# Patient Record
Sex: Male | Born: 1990 | Race: Black or African American | Hispanic: No | Marital: Single | Smoking: Never smoker
Health system: Southern US, Community
[De-identification: ages and names within clinical notes are randomized; demographics above are authoritative.]

## PROBLEM LIST (undated history)

## (undated) DIAGNOSIS — R569 Unspecified convulsions: Secondary | ICD-10-CM

## (undated) DIAGNOSIS — J45909 Unspecified asthma, uncomplicated: Secondary | ICD-10-CM

## (undated) DIAGNOSIS — G40909 Epilepsy, unspecified, not intractable, without status epilepticus: Secondary | ICD-10-CM

## (undated) DIAGNOSIS — R6250 Unspecified lack of expected normal physiological development in childhood: Secondary | ICD-10-CM

---
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
## 2010-01-01 ENCOUNTER — Emergency Department: Payer: Self-pay | Admitting: Emergency Medicine

## 2011-01-23 ENCOUNTER — Emergency Department: Payer: Self-pay | Admitting: Emergency Medicine

## 2012-01-07 ENCOUNTER — Emergency Department: Payer: Self-pay | Admitting: Emergency Medicine

## 2012-03-09 ENCOUNTER — Emergency Department: Payer: Self-pay | Admitting: *Deleted

## 2012-03-09 LAB — DRUG SCREEN, URINE
Amphetamines, Ur Screen: NEGATIVE (ref ?–1000)
Barbiturates, Ur Screen: NEGATIVE (ref ?–200)
Benzodiazepine, Ur Scrn: NEGATIVE (ref ?–200)
Cannabinoid 50 Ng, Ur ~~LOC~~: NEGATIVE (ref ?–50)
MDMA (Ecstasy)Ur Screen: NEGATIVE (ref ?–500)
Methadone, Ur Screen: NEGATIVE (ref ?–300)
Opiate, Ur Screen: NEGATIVE (ref ?–300)
Phencyclidine (PCP) Ur S: NEGATIVE (ref ?–25)
Tricyclic, Ur Screen: NEGATIVE (ref ?–1000)

## 2012-03-09 LAB — COMPREHENSIVE METABOLIC PANEL
Albumin: 4.4 g/dL (ref 3.4–5.0)
Co2: 26 mmol/L (ref 21–32)
EGFR (African American): >60
EGFR (Non-African Amer.): >60
Glucose: 97 mg/dL (ref 65–99)
SGPT (ALT): 33 U/L
Sodium: 142 mmol/L (ref 136–145)
Total Protein: 7.9 g/dL (ref 6.4–8.2)

## 2012-03-09 LAB — URINALYSIS, COMPLETE
Bacteria: NONE SEEN
Bilirubin,UR: NEGATIVE
Blood: NEGATIVE
Leukocyte Esterase: NEGATIVE
Nitrite: NEGATIVE
Ph: 6 (ref 4.5–8.0)
Protein: 100
RBC,UR: 2 /HPF (ref 0–5)
Specific Gravity: 1.026 (ref 1.003–1.030)
Squamous Epithelial: <1
WBC UR: 3 /HPF (ref 0–5)

## 2012-03-09 LAB — CBC
MCH: 33 pg (ref 26.0–34.0)
MCV: 96 fL (ref 80–100)
Platelet: 237 10*3/uL (ref 150–440)
RBC: 4.8 10*6/uL (ref 4.40–5.90)
WBC: 7.7 10*3/uL (ref 3.8–10.6)

## 2012-03-09 LAB — TROPONIN I: Troponin-I: <0.02 ng/mL

## 2012-07-04 ENCOUNTER — Inpatient Hospital Stay: Payer: Self-pay | Admitting: Internal Medicine

## 2012-07-04 LAB — COMPREHENSIVE METABOLIC PANEL
Albumin: 3.9 g/dL (ref 3.4–5.0)
Alkaline Phosphatase: 70 U/L (ref 50–136)
Calcium, Total: 9.1 mg/dL (ref 8.5–10.1)
Chloride: 107 mmol/L (ref 98–107)
Co2: 27 mmol/L (ref 21–32)
EGFR (African American): >60
EGFR (Non-African Amer.): >60
Osmolality: 285 (ref 275–301)
SGOT(AST): 20 U/L (ref 15–37)
SGPT (ALT): 27 U/L
Sodium: 143 mmol/L (ref 136–145)

## 2012-07-04 LAB — URINALYSIS, COMPLETE
Bacteria: NONE SEEN
Bilirubin,UR: NEGATIVE
Glucose,UR: 150 mg/dL (ref 0–75)
Ketone: NEGATIVE
Ph: 6 (ref 4.5–8.0)
Protein: NEGATIVE
RBC,UR: 1 /HPF (ref 0–5)
Squamous Epithelial: NONE SEEN

## 2012-07-04 LAB — DRUG SCREEN, URINE
Amphetamines, Ur Screen: NEGATIVE (ref ?–1000)
Barbiturates, Ur Screen: NEGATIVE (ref ?–200)
Benzodiazepine, Ur Scrn: NEGATIVE (ref ?–200)
Cocaine Metabolite,Ur ~~LOC~~: NEGATIVE (ref ?–300)
MDMA (Ecstasy)Ur Screen: NEGATIVE (ref ?–500)
Methadone, Ur Screen: NEGATIVE (ref ?–300)
Opiate, Ur Screen: NEGATIVE (ref ?–300)
Tricyclic, Ur Screen: NEGATIVE (ref ?–1000)

## 2012-07-04 LAB — CBC
HGB: 16.8 g/dL (ref 13.0–18.0)
MCV: 94 fL (ref 80–100)
Platelet: 237 10*3/uL (ref 150–440)
RBC: 5.13 10*6/uL (ref 4.40–5.90)
RDW: 12.6 % (ref 11.5–14.5)
WBC: 4.7 10*3/uL (ref 3.8–10.6)

## 2012-08-01 ENCOUNTER — Ambulatory Visit: Payer: Self-pay | Admitting: Neurology

## 2015-03-22 NOTE — Discharge Summary (Signed)
PATIENT NAME:  Isaac Vaughn, Isaac Vaughn MR#:  [id number] DATE OF BIRTH:  11-24-1991  DATE OF ADMISSION:  07/04/2012 DATE OF DISCHARGE:  07/06/2012  ADMITTING DIAGNOSIS: Tonic-clonic seizure.   DISCHARGE DIAGNOSES:  1. Tonic-clonic seizure, second episode, patient now on antiepileptics.  2. Elevated blood pressure noted during initial presentation, could be related to agitation/anxiety. His diet needs to be monitored.  3. Hyperglycemia on presentation, only blood sugar of 100 was likely reactive.  4. Seasonal allergies.  5. Cognitive impairment.   CONSULTANT: Neurology, Dr. Sherryll BurgerShah    LABORATORY, DIAGNOSTIC, AND RADIOLOGICAL DATA: Laboratory evaluation showed a glucose of 100, BUN 12, creatinine 1.18, sodium 143, potassium 3.8, chloride 107, CO2 27, calcium 9.1. LFTs were normal. TUDS negative. WBC 4.7, hemoglobin 16.8, platelet count 273.   His CT scan of the head showed no acute abnormality.   MRI of the brain showed no acute abnormality.   HOSPITAL COURSE: Please refer to history and physical done by the admitting physician. The patient is a 24 year old [place] male with history of special needs/mentally challenged who was brought in by family for episode of tonic-clonic seizure. The patient had a witnessed seizure in April 2013 and was brought to the ED. Work-up including CT scan of the head was negative. The patient was not started on any antiepileptics due to his first episode. He again experienced the same tonic-clonic type of seizure activity with postictal state. He was brought back to the ED. He had a CT scan the head which was negative. He also underwent an MRI to make sure there was no other abnormality causing seizures. The patient was started on Keppra due to his second episode. He was seen by Dr. Cristopher PeruHemang Shah who recommended continuing Keppra. Due to it being the weekend, he recommended getting an EEG done as an outpatient which will be arranged. The patient had not had any further  episodes after his initial episode. The patient is stable for discharge.   DISCHARGE MEDICATIONS:  1. Claritin 10 mg daily.  2. Keppra 500 1 tab p.o. b.i.d. x14 days, then Keppra 750 1 tab p.o. b.i.d.   HOME OXYGEN: None.   DIET: Low sodium.   ACTIVITY: As tolerated.   The patient does not drive, which he needs to continue not to drive which is recommended.   TIMEFRAME FOR FOLLOW-UP: 1 to 2 weeks with primary MD and 1 to 2 weeks with Dr. Sherryll BurgerShah. The patient is to have an outpatient EEG.   TIME SPENT ON DISCHARGE: 35 minutes.   ____________________________ Lacie ScottsShreyang H. Allena KatzPatel, MD shp:drc D: 07/08/2012 08:07:56 ET T: 07/08/2012 08:45:59 ET JOB#: [id number]  cc: Alleah Dearman H. Allena KatzPatel, MD, <Dictator> Charise CarwinSHREYANG H Murdis Flitton MD ELECTRONICALLY SIGNED 07/10/2012 13:41

## 2015-03-22 NOTE — Consult Note (Signed)
Brief Consult Note: Diagnosis: 2nd unprovoked seizure, Intellectual Disability, Epilepsy.   Patient was seen by consultant.   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 - MRI brain without contrast OK (thin coronal cuts through temporal lobes not available) - agree with levetiracetam 500 mg bid. - EEG can be done as out pt (as it is not available till Monday) - Pt back to baseline per mother. - will follow as out pt.  Electronic Signatures: Jolene ProvostShah, Alysandra Lobue Kalpeshkumar (MD)  (Signed 03-Aug-13 22:11)  Authored: Brief Consult Note   Last Updated: 03-Aug-13 22:11 by Jolene ProvostShah, Ottie Neglia Kalpeshkumar (MD)

## 2015-03-22 NOTE — Consult Note (Signed)
PATIENT NAME:  Isaac Vaughn, Christohper M MR#:  [id number] DATE OF BIRTH:  04-27-1991  DATE OF CONSULTATION:  07/05/2012  REFERRING PHYSICIAN:  Dr. Dava NajjarPanwar  CONSULTING PHYSICIAN:  Jennelle Pinkstaff K. Sherryll BurgerShah, MD  REASON FOR CONSULTATION: Seizure.   HISTORY OF PRESENT ILLNESS: Isaac Vaughn is a 24 year old African American gentleman with intellectual disability. He had first seizure in April 2013.   He had a second seizure on 07/04/2012. When he did not make up in the morning his usual time and mother sent his younger brother to the room to check on him and younger brother stated he is not feeling well.   When the mother reached the room patient was noticed to have open glazed-looking eyes in his bed. He was stiff and was having shivering. He was having heavy breathing. He was making some sounds from his mouth.   This lasted for a couple of seconds. He has tears coming out of his eyes. He did not bite his tongue for pee on himself or had a bowel movement.   Afterwards patient was confused and could not walk and could not speak for almost 1 to 2 hours.   Patient was very sleepy and tired and drained.   It took him around 24 hours to become back to himself.   Patient's mother mentioned that his seminology was very similar in April but not as intense.   Patient is not having any spells of waking up in unusual places or having unexplained any bruises, etc., or having spells of memory loss.   Patient has a history of developmental disability in the terms of he was had full term, had full immunizations but he just didn't walk at the right age and didn't talk at the right age.   He has finished a special ed high school education and at home he is living with his mother. He is able to take care of his activities of daily living but he cannot follow complex commands. He needed all the commands to be broke down into very simple things.   He is not eating healthy per mother.   PAST MEDICAL HISTORY:  1. Intellectual  disability. 2. Seasonal allergies. He takes Claritin on a regular basis.   PAST SURGICAL HISTORY: None.   ALLERGIES: He is allergic to no known drugs.   FAMILY HISTORY: Significant that mother has hypertension. Father has hyperlipidemia. One of his uncles had history of seizure as a child. Cousin on the mother's side has a history of seizure.   SOCIAL HISTORY: Significant that he does not smoke, does not drink alcohol. Never had DUI. Does not do recreational drugs, etc.   REVIEW OF SYSTEMS: Patient's review of systems was difficult to obtain.   PHYSICAL EXAMINATION:  VITAL SIGNS: Temperature 98.3, pulse 69, respiratory rate 17, blood pressure 152/94, pulse oximetry 100%.   He is an obese African American young gentleman lying in bed, not in acute distress, surrounded by family members.   He has some lesion on his scalp. 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   Patient smiles at some times inappropriately.   LUNGS: Clear to auscultation.   HEART: S1, S2 heart sounds. Carotid exam did not reveal any bruit.   Funduscopic exam was attempted but his pupils are very miotic.    NEUROLOGIC: On his mental status he was alert. He was oriented. He followed one-step commands. He was "simple minded".   Had difficulty with following two-step inverted commands.   He would  look at his mother with some complex questions.   But otherwise his attention and concentration seems to be okay.   He has impairment of his remote memory and fund of knowledge.   On his cranial nerves, his pupils were equal, round, and reactive. Extraocular movements are intact. His face was symmetric. Tongue was midline. Facial sensations were intact. His visual seems symmetric. His hearing was okay.   On his motor examination, he has normal tone and strength of 5/5 in all extremities.   His sensations were intact to light touch. His deep tendon reflexes were 1+. His toes were mute.   I did not  check his gait but mother told me that his gait was normal.   ASSESSMENT AND PLAN:  1. Second unprovoked seizure with normal electrolytes, no known alcohol or sleep deprivation.   He does not have early morning myoclonic jerks.   His neuroimaging was unremarkable by report.   Due to his history of intellectual disability and him having second unprovoked seizure I think he has epilepsy and we should start him on antiepileptic medication.   He has already been started on levetiracetam 500 mg p.o. b.i.d. We should continue that.   I talked to the family about potential medication side effects, interactions, etc.   I talked to them about etiology, pathogenesis and progression of epilepsy.   I talked to the patient and family about seizure precautions such as keeping the patient safe at the time of seizure, turning him on his side, not putting anything in mouth. Patient should not be driving (he doesn't  drive anyway due to his intellectual disability).   He should not be swimming by himself unsupervised.   I also talked to them about the things to look out for in terms of complex partial seizures.   Based on his semiology afraid of temporal lobe seizures.   2. Intellectual disability/static encephalopathy. He seems to have compensated really well and he has a good social support system.   3. Obesity. Briefly advised mom that he should be eating healthy, etc.    I will see him back as an outpatient. Patient has an EEG scheduled but unfortunately we do not have a tech available until Monday. As he is back to himself he can be discharged and EEG can be done as an outpatient.   ____________________________ Luvinia Lucy K. Sherryll Burger, MD hks:cms D: 07/05/2012 22:25:44 ET T: 07/06/2012 13:53:39 ET JOB#: [id number]  cc: Kendan Cornforth K. Sherryll Burger, MD, <Dictator> [facility] MD ELECTRONICALLY SIGNED 07/06/2012 18:18

## 2015-03-22 NOTE — H&P (Signed)
PATIENT NAME:  Isaac Vaughn, Isaac Vaughn MR#:  [id number] DATE OF BIRTH:  [birth year]  DATE OF ADMISSION:  07/04/2012  REFERRING ER PHYSICIAN:  Dr. Enedina FinnerGoli [facility]   CHIEF COMPLAINT: Seizure.   HISTORY OF PRESENT ILLNESS: The patient is a 24 year old male with history of special needs/mentally challenged who is not able to provide much information. The majority of information was obtained from the patient's mother and other family members. The patient had a witnessed seizure in April 2013 and was brought to the Emergency Room at that time. Work-up including CT of the head was negative and the patient was discharged home to follow up with his PCP. Subsequently the mother took the patient to the primary care physician who found no abnormalities and told the mother that if the patient had another seizure he would be referred for specialty consultation to a neurologist.  The patient was doing well since April until this morning when the patient's younger brother went to call him and came back and told the mother that the patient reported that he did not feel well.  The mother went to see him and found him having a generalized tonic-clonic seizure. Therefore she called EMS and brought him to the Emergency Room. The patient was postictal.  Currently he is awake and alert. He is able to tell me his age and name, but as per the family is not back to his baseline.   ALLERGIES: No known drug allergies.   PAST MEDICAL HISTORY:  1. Mentally challenged. 2. Seasonal allergies.   MEDICATIONS: Claritin as needed.   PAST SURGICAL HISTORY: None.   FAMILY HISTORY: Mother has hypertension. Father has hyperlipidemia. One of his uncles had one episode of seizure as a child. Cousin on the mother's side had seizures.   SOCIAL HISTORY: There is no history of smoking, alcohol, or drug abuse.  The patient is mentally challenged and lives with his mother.    REVIEW OF SYSTEMS: The  patient is mentally challenged and does not provide a reliable review of systems.   PHYSICAL EXAMINATION:  VITAL SIGNS: Temperature 98.5, heart rate 74, respiratory rate 18, blood pressure 164/82, pulse oximetry 100% on room air.   GENERAL: The patient is a young African American who is overweight, sitting comfortably in bed, not in acute distress.   HEAD: Atraumatic, normocephalic.   EYES: No pallor, icterus, or cyanosis. Pupils equal, round, reactive to light and accommodation. Extraocular movements intact.   ENT: Wet mucous membranes. No oropharyngeal erythema or thrush. There is no evidence of any tongue bite.   NECK: Supple. No masses. No JVD. No thyromegaly or lymphadenopathy.   CHEST WALL: No tenderness to palpation. Not using accessory muscles of respiration. No intercostal muscle retractions.   LUNGS: Bilaterally clear to auscultation. No wheezing, rales, or rhonchi.   CARDIOVASCULAR: S1, S2 regular. No murmur, rubs, or gallops.   ABDOMEN: Soft, nontender, nondistended. No guarding or rigidity. No organomegaly. Normal bowel sounds.   SKIN: No rashes or lesions.  PERIPHERIES: No pedal edema. 2+ pedal pulses.   MUSCULOSKELETAL: No cyanosis or clubbing.   NEUROLOGIC: Awake, alert, oriented times three. Nonfocal neurological exam. Cranial nerves grossly intact.   PSYCH: Normal mood and affect.   LABORATORY, DIAGNOSTIC, AND RADIOLOGICAL DATA: Urinalysis shows no evidence of infection. CBC is normal. Urine drug screen is negative. Complete metabolic panel is normal other than glucose of 100.   ASSESSMENT AND PLAN: 24 year old male with history of special needs,  mentally challenged, who had a seizure in 03/2012 at which time a CT of the head was negative. He was discharged home and seen by his primary care physician who told the family that if the patient had another seizure he would be referred to a neurologist.  He was brought in by his mother for a second witnessed seizure.   1. Recurrent seizures: We will admit the patient to the hospital and obtain EEG, MRI of the brain, and neurology consultation. We will load him with Keppra and start IV Keppra b.i.d.  We will also place on p.r.n. Ativan for breakthrough seizures. 2. Hyperglycemia, possibly reactive.  3. Elevated blood pressure. This could be related to his recent seizure. However, he also overweight with family history of hypertension. We will monitor his blood pressure and start him on antihypertensive medications as needed to achieve good hypertensive control.  Reviewed all medical records, discussed with the ER physician,  discussed with the patient's family the plan of care and management.   TIME SPENT: 75 minutes.   ____________________________ Darrick Meigs, MD sp:bjt D: 07/04/2012 14:35:06 ET T: 07/04/2012 14:54:14 ET JOB#: [id number]  cc: Darrick Meigs, MD, <Dictator> [facility] Darrick Meigs MD ELECTRONICALLY SIGNED 07/04/2012 17:41

## 2015-07-10 ENCOUNTER — Emergency Department
Admission: EM | Admit: 2015-07-10 | Discharge: 2015-07-11 | Disposition: A | Discharge status: Home / Self Care | Payer: Medicaid Other | Attending: Student | Admitting: Student

## 2015-07-10 ENCOUNTER — Encounter: Payer: Self-pay | Admitting: Emergency Medicine

## 2015-07-10 DIAGNOSIS — G40909 Epilepsy, unspecified, not intractable, without status epilepticus: Secondary | ICD-10-CM | POA: Insufficient documentation

## 2015-07-10 DIAGNOSIS — R569 Unspecified convulsions: Secondary | ICD-10-CM | POA: Diagnosis present

## 2015-07-10 DIAGNOSIS — Z79899 Other long term (current) drug therapy: Secondary | ICD-10-CM | POA: Insufficient documentation

## 2015-07-10 HISTORY — DX: Epilepsy, unspecified, not intractable, without status epilepticus: G40.909

## 2015-07-10 LAB — CBC WITH DIFFERENTIAL/PLATELET
Basophils Absolute: 0 K/uL (ref 0–0.1)
Basophils Relative: 0 %
Eosinophils Absolute: 0.1 K/uL (ref 0–0.7)
Eosinophils Relative: 2 %
HCT: 47.3 % (ref 40.0–52.0)
Hemoglobin: 16.2 g/dL (ref 13.0–18.0)
Lymphocytes Relative: 32 %
Lymphs Abs: 2.1 K/uL (ref 1.0–3.6)
MCH: 33.1 pg (ref 26.0–34.0)
MCHC: 34.3 g/dL (ref 32.0–36.0)
MCV: 96.7 fL (ref 80.0–100.0)
Monocytes Absolute: 0.5 K/uL (ref 0.2–1.0)
Monocytes Relative: 8 %
Neutro Abs: 3.9 K/uL (ref 1.4–6.5)
Neutrophils Relative %: 58 %
Platelets: 205 K/uL (ref 150–440)
RBC: 4.89 MIL/uL (ref 4.40–5.90)
RDW: 12.8 % (ref 11.5–14.5)
WBC: 6.7 K/uL (ref 3.8–10.6)

## 2015-07-10 LAB — GLUCOSE, CAPILLARY: Glucose-Capillary: 116 mg/dL — ABNORMAL HIGH (ref 65–99)

## 2015-07-10 LAB — COMPREHENSIVE METABOLIC PANEL
ALT: 25 U/L (ref 17–63)
AST: 34 U/L (ref 15–41)
Albumin: 4.7 g/dL (ref 3.5–5.0)
Alkaline Phosphatase: 45 U/L (ref 38–126)
Anion gap: 6 (ref 5–15)
BUN: 14 mg/dL (ref 6–20)
CALCIUM: 9.3 mg/dL (ref 8.9–10.3)
CHLORIDE: 108 mmol/L (ref 101–111)
CO2: 27 mmol/L (ref 22–32)
Creatinine, Ser: 1.26 mg/dL — ABNORMAL HIGH (ref 0.61–1.24)
GFR calc Af Amer: >60 mL/min (ref >60)
GFR calc non Af Amer: >60 mL/min (ref >60)
Glucose, Bld: 100 mg/dL — ABNORMAL HIGH (ref 65–99)
Potassium: 3.8 mmol/L (ref 3.5–5.1)
Sodium: 141 mmol/L (ref 135–145)
TOTAL PROTEIN: 7.8 g/dL (ref 6.5–8.1)
Total Bilirubin: 0.5 mg/dL (ref 0.3–1.2)

## 2015-07-10 MED ORDER — ZONISAMIDE 100 MG PO CAPS
200.0000 mg | ORAL_CAPSULE | Freq: Once | ORAL | Status: AC
  Administered 2015-07-10: 200 mg via ORAL
  Filled 2015-07-10: qty 2

## 2015-07-10 NOTE — ED Notes (Signed)
MD entered order for pt to take own Zonisamide; pt currently having difficulty swallowing even a small sip of water; MD aware; holding medication at this time; pt's mother and grandmother at bedside

## 2015-07-10 NOTE — ED Notes (Signed)
Side rails up x 2 and padded at this time for safety; pt remains alert and answering questions with shake yes/no of head;

## 2015-07-10 NOTE — ED Provider Notes (Addendum)
[facility] Emergency Department Provider Note  ____________________________________________  Time seen: Approximately 9:30 PM  I have reviewed the triage vital signs and the nursing notes.   HISTORY  Chief Complaint Seizures  Caveat-history of present illness and review of systems Limited secondary to the patient's inability to verbalize at this time. History of present illness and review of systems is obtained from family at bedside.  HPI Isaac Vaughn is a 24 y.o. male with "special needs", history of seizure disorder presents for evaluation after seizure which occurred suddenly just prior to arrival, now resolved. Family at bedside reports that the patient had a 10-15 second episode where his "eyes rolled back and his arms were tense and shaking". He did not fall or hit his head. He has a history of grand mal seizures. He was not able to take his zonisamide last night because it was packed away in a  box as they have recently moved/change residencies. Family reports that the patient has not been ill recently, no cough, sneezing, runny nose, congestion, vomiting, diarrhea, fevers or chills. Family reports that after seizure, it is customary for him to not be able to verbalize/speak for some time. Current severity of symptoms is moderate.   Past Medical History  Diagnosis Date  . Epilepsy     There are no active problems to display for this patient.   History reviewed. No pertinent past surgical history.  Current Outpatient Rx  Name  Route  Sig  Dispense  Refill  . montelukast (SINGULAIR) 10 MG tablet   Oral   Take 1 tablet by mouth daily.      3   . zonisamide (ZONEGRAN) 100 MG capsule   Oral   Take 2 capsules by mouth daily.      1     Allergies Review of patient's allergies indicates not on file.  History reviewed. No pertinent family history.  Social History History  Substance Use Topics  . Smoking status: Never Smoker   . Smokeless  tobacco: Not on file  . Alcohol Use: No    Review of Systems   Caveat-history of present illness and review of systems Limited secondary to the patient's inability to verbalize at this time. History of present illness and review of systems is obtained from family at bedside. ____________________________________________   PHYSICAL EXAM:  VITAL SIGNS: ED Triage Vitals  Enc Vitals Group     BP 07/10/15 2119 159/83 mmHg     Pulse Rate 07/10/15 2119 94     Resp 07/10/15 2119 21     Temp 07/10/15 2119 98.3 F (36.8 C)     Temp Source 07/10/15 2119 Oral     SpO2 07/10/15 2119 100 %     Weight 07/10/15 2119 238 lb (107.956 kg)     Height 07/10/15 2119 5\' 10"  (1.778 m)     Head Cir --      Peak Flow --      Pain Score 07/10/15 2120 0     Pain Loc --      Pain Edu? --      Excl. in GC? --     Constitutional: The patient is awake, alert, looking around the room, does not speak/verbalize but follows commands, answers questions by nodding yes/no with his head. Eyes: Conjunctivae are normal. PERRL. EOMI. Head: Atraumatic. Nose: No congestion/rhinnorhea. Mouth/Throat: Mucous membranes are moist.  Oropharynx non-erythematous. Neck: No stridor.   Cardiovascular: Normal rate, regular rhythm. Grossly normal heart sounds.  Good peripheral circulation. Respiratory: Normal respiratory effort.  No retractions. Lungs CTAB. Gastrointestinal: Soft and nontender. No distention. No abdominal bruits. No CVA tenderness. Genitourinary: deferred Musculoskeletal: No lower extremity tenderness nor edema.  No joint effusions. Neurologic: No gross focal neurologic deficits are appreciated. 5 out of 5 strength in bilateral upper and lower extremities though full effort requires significant encouragement. Sensation intact to light touch throughout. Skin:  Skin is warm, dry and intact. No rash noted. Psychiatric: Mood and affect are normal. Speech and behavior are  normal.  ____________________________________________   LABS (all labs ordered are listed, but only abnormal results are displayed)  Labs Reviewed  COMPREHENSIVE METABOLIC PANEL - Abnormal; Notable for the following:    Glucose, Bld 100 (*)    Creatinine, Ser 1.26 (*)    All other components within normal limits  GLUCOSE, CAPILLARY - Abnormal; Notable for the following:    Glucose-Capillary 116 (*)    All other components within normal limits  CBC WITH DIFFERENTIAL/PLATELET  CBG MONITORING, ED   ____________________________________________  EKG  ED ECG REPORT I, Gayla Doss, the attending physician, personally viewed and interpreted this ECG.   Date: 07/10/2015  EKG Time: 21:18  Rate: 84  Rhythm: sinus rhythm marked sinus arrhythmia  Axis: normal  Intervals:none  ST&T Change: No acute ST segment elevation. EKG unchanged from 07/04/2012.  ____________________________________________  RADIOLOGY  none ____________________________________________   PROCEDURES  Procedure(s) performed: None  Critical Care performed: No  ____________________________________________   INITIAL IMPRESSION / ASSESSMENT AND PLAN / ED COURSE  Pertinent labs & imaging results that were available during my care of the patient were reviewed by me and considered in my medical decision making (see chart for details).  Isaac Vaughn is a 24 y.o. male with history of "special needs" and seizure disorder who presents for evaluation after seizure which occurred suddenly just prior to arrival, now resolved. On exam, he is generally well-appearing and in no acute distress. Vital signs stable, he is afebrile. He has an intact neurological exam, is awake, alert, looking around the room but is not verbalizing.  Family reports that this is customary for him after he has a seizure, though it seems a bit unusual to me as this is his only postictal symptom. Suspect seizure secondary to medication  noncompliance given that he missed his home zonisamide dose last night. We'll give his home medications, and observe in the emergency department. Labs notable for mild creatinine elevation at 1.26, baseline appears to be 1.18 so he is not far from baseline. Normal CBC. Normal glucose. No indication for head imaging at this time though would obtain head imaging for prolonged post-ictal symptomatology or recurrent seizure.  ----------------------------------------- 11:36 PM on 07/10/2015 -----------------------------------------  I administered the patient's medications by mouth without any issue; he swallowed them just fine and is tolerating PO. Patient still awake, alert, nonverbal. Family reports that he can take "a few hours" for him to be able to speak again after seizure. He has had no recurrent seizure activity since arrival to the emergency department. Anticipate discharge when he is back to baseline. Care transferred to Dr. Ladona Ridgel at this time. ____________________________________________   FINAL CLINICAL IMPRESSION(S) / ED DIAGNOSES  Final diagnoses:  Seizure      Gayla Doss, MD 07/10/15 [id number]  Gayla Doss, MD 07/10/15 [id number]  Gayla Doss, MD 07/10/15 [id number]

## 2015-07-10 NOTE — ED Notes (Signed)
EMS pt from home following grand mal seizure lasting 10-15 seconds per family; EMS reports another seizure after their arrival; pt nodding head yes/no to questions upon arrival; nonverbal at this time; FSBS 93, BP 148/88; pt with history of seizures and did not take his prescribed medication for same last night;

## 2015-07-11 NOTE — ED Provider Notes (Signed)
Progress note:    ----------------------------------------- 12:34 AM on 07/11/2015 -----------------------------------------  Patient has been observed for quite some time since his seizure. Initially patient felt like he could not talk after seizure which was normal. Patient is now feeling much better and is able to talk normally and is alert and oriented 3. Patient is going to be given a refill on his prescription and be discharged home to follow up with his neurologist or family M.D.  Leona Carry, MD 07/11/15 [phone]

## 2015-11-16 ENCOUNTER — Emergency Department
Admission: EM | Admit: 2015-11-16 | Discharge: 2015-11-17 | Disposition: A | Discharge status: Home / Self Care | Payer: Medicaid Other | Attending: Emergency Medicine | Admitting: Emergency Medicine

## 2015-11-16 DIAGNOSIS — Z79899 Other long term (current) drug therapy: Secondary | ICD-10-CM | POA: Insufficient documentation

## 2015-11-16 DIAGNOSIS — R569 Unspecified convulsions: Secondary | ICD-10-CM | POA: Diagnosis present

## 2015-11-16 MED ORDER — ACETAMINOPHEN 325 MG PO TABS
ORAL_TABLET | ORAL | Status: AC
  Administered 2015-11-16: 650 mg via ORAL
  Filled 2015-11-16: qty 2

## 2015-11-16 MED ORDER — ACETAMINOPHEN 325 MG PO TABS
650.0000 mg | ORAL_TABLET | Freq: Once | ORAL | Status: AC
  Administered 2015-11-16: 650 mg via ORAL

## 2015-11-16 NOTE — Discharge Instructions (Signed)
You were evaluated after a seizure, and you should call your neurologist in the morning to discuss and changes in medications.  Return to the emergency department for any worsening condition including seizure lasting longer than 5 minutes, any injuries, or any other new neurologic symptoms such as headache, confusion or altered mental status.   Seizure, Adult A seizure is abnormal electrical activity in the brain. Seizures usually last from 30 seconds to 2 minutes. There are various types of seizures. Before a seizure, you may have a warning sensation (aura) that a seizure is about to occur. An aura may include the following symptoms:   Fear or anxiety.  Nausea.  Feeling like the room is spinning (vertigo).  Vision changes, such as seeing flashing lights or spots. Common symptoms during a seizure include:  A change in attention or behavior (altered mental status).  Convulsions with rhythmic jerking movements.  Drooling.  Rapid eye movements.  Grunting.  Loss of bladder and bowel control.  Bitter taste in the mouth.  Tongue biting. After a seizure, you may feel confused and sleepy. You may also have an injury resulting from convulsions during the seizure. HOME CARE INSTRUCTIONS   If you are given medicines, take them exactly as prescribed by your health care provider.  Keep all follow-up appointments as directed by your health care provider.  Do not swim or drive or engage in risky activity during which a seizure could cause further injury to you or others until your health care provider says it is OK.  Get adequate rest.  Teach friends and family what to do if you have a seizure. They should:  Lay you on the ground to prevent a fall.  Put a cushion under your head.  Loosen any tight clothing around your neck.  Turn you on your side. If vomiting occurs, this helps keep your airway clear.  Stay with you until you recover.  Know whether or not you need emergency  care. SEEK IMMEDIATE MEDICAL CARE IF:  The seizure lasts longer than 5 minutes.  The seizure is severe or you do not wake up immediately after the seizure.  You have an altered mental status after the seizure.  You are having more frequent or worsening seizures. Someone should drive you to the emergency department or call local emergency services (911 in [place] MAKE SURE YOU:  Understand these instructions.  Will watch your condition.  Will get help right away if you are not doing well or get worse.   This information is not intended to replace advice given to you by your health care provider. Make sure you discuss any questions you have with your health care provider.   Document Released: 11/16/2000 Document Revised: 12/10/2014 Document Reviewed: 07/01/2013 Elsevier Interactive Patient Education [employer]2016 Elsevier Inc.

## 2015-11-16 NOTE — ED Notes (Signed)
Pt mother requesting something for pain, states pt is hurting. Pt tearful but unable to state or point to where pain is. Asked pt to squeeze this RN hand if pt hurting in abdomen, pt squeezed hand, asked pt to squeeze hand if pain is new onset, pt squeezed hand for yes. MD notified. Verbal order for Tylenol.

## 2015-11-16 NOTE — ED Provider Notes (Signed)
[facility] Emergency Department Provider Note   ____________________________________________  Time seen:  I have reviewed the triage vital signs and the triage nursing note.  HISTORY  Chief Complaint Seizures   Historian Patient's mom  HPI Isaac Vaughn is a 24 y.o. 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  Last seizure was in August. No change in medication were made at that point in time. Today's episode mom witnessed the patient to have staring episode and glassy appearing eyes and then slipped on the floor. No generalized shaking. This is similar to prior episodes of what sounds like diagnosed partial seizures. [facility]    Past Medical History  Diagnosis Date  . Epilepsy (HCC)     There are no active problems to display for this patient.   History reviewed. No pertinent past surgical history.  Current Outpatient Rx  Name  Route  Sig  Dispense  Refill  . montelukast (SINGULAIR) 10 MG tablet   Oral   Take 1 tablet by mouth daily.      3   . zonisamide (ZONEGRAN) 100 MG capsule   Oral   Take 2 capsules by mouth daily.      1     Allergies Review of patient's allergies indicates no known allergies.  No family history on file.  Social History Social History  Substance Use Topics  . Smoking status: Never Smoker   . Smokeless tobacco: None  . Alcohol Use: No    Review of Systems  Constitutional: Negative for fever. Eyes: Negative for visual changes. ENT: Negative for sore throat. Cardiovascular: Negative for chest pain. Respiratory: Negative for shortness of breath. Gastrointestinal: Negative for abdominal pain, vomiting and diarrhea. Genitourinary: Negative for dysuria. Musculoskeletal: Negative for back pain. Skin: Negative for rash. Neurological: Negative for headache. 10 point Review of Systems otherwise  negative ____________________________________________   PHYSICAL EXAM:  VITAL SIGNS: ED Triage Vitals  Enc Vitals Group     BP 11/16/15 2107 157/84 mmHg     Pulse Rate 11/16/15 2107 74     Resp 11/16/15 2107 25     Temp 11/16/15 2107 98.8 F (37.1 C)     Temp Source 11/16/15 2107 Oral     SpO2 11/16/15 2107 100 %     Weight --      Height --      Head Cir --      Peak Flow --      Pain Score 11/16/15 2107 0     Pain Loc --      Pain Edu? --      Excl. in GC? --      Constitutional: Alert with eyes open but not following commands. Well appearing and in no distress. Eyes: Conjunctivae are normal. PERRL. Normal extraocular movements. ENT   Head: Normocephalic and atraumatic.   Nose: No congestion/rhinnorhea.   Mouth/Throat: Mucous membranes are moist.   Neck: No stridor. Cardiovascular/Chest: Normal rate, regular rhythm.  No murmurs, rubs, or gallops. Respiratory: Normal respiratory effort without tachypnea nor retractions. Breath sounds are clear and equal bilaterally. No wheezes/rales/rhonchi. Gastrointestinal: Soft. No distention, no guarding, no rebound. Nontender   Genitourinary/rectal:Deferred Musculoskeletal: Nontender with normal range of motion in all extremities. No joint effusions.  No lower extremity tenderness.  No edema. Neurologic: Appears postictal, not verbalizing any responses. Intermittently following commands. No gross or focal neurologic deficits are appreciated. Skin:  Skin is warm, dry and intact. No rash noted.   ____________________________________________   EKG I, Governor Rooks, MD, the attending physician have personally viewed and interpreted all ECGs.  No EKG performed ____________________________________________  LABS (pertinent positives/negatives)  None  ____________________________________________  RADIOLOGY All Xrays were viewed by me. Imaging interpreted by  Radiologist.  None __________________________________________  PROCEDURES  Procedure(s) performed: None  Critical Care performed: None  ____________________________________________   ED COURSE / ASSESSMENT AND PLAN  CONSULTATIONS: Discussed with on-call [facility] neurologist, who recommends no acute change in her seizure medications tonight, and asked patient to call office in the morning to discuss change in medications  Pertinent labs & imaging results that were available during my care of the patient were reviewed by me and considered in my medical decision making (see chart for details).   Mom states the episode tonight looked like the patient's typical seizures. No reported trauma. This episode does seem consistent with his prior seizure, and not a new episode of syncope or otherwise. Patient is currently postictal, and mom states his postictal state May last several hours.  Family does not report that the patient uses any illicit drugs, has had sleep deprivation, or any recent illness.  After discussion with on-call neurologist, recommend patient/family call their neurologist office in the morning to discuss any changes in medication regimen and to follow-up appointment.  Patient care transferred to Dr. Manson Passey at midnight for shift change. Patient is being observed until postictal state resolved and patient at baseline mental status. Patient will be discharged with my instructions.  Patient / Family / Caregiver informed of clinical course, medical decision-making process, and agree with plan.   I discussed return precautions, follow-up instructions, and discharged instructions with patient and/or family.   ___________________________________________   FINAL CLINICAL IMPRESSION(S) / ED DIAGNOSES   Final diagnoses:  Seizure (HCC)       Governor Rooks, MD 11/16/15 2337

## 2015-11-16 NOTE — ED Notes (Signed)
Pt bib EMS w/ c/o seizure. Per EMS, pts mother witnessed seizure and stated it was a few seconds long.  Pt does have hx of epilepsy and per EMS, pt does take medication.  Pt alert , NAD, resp even and unlabored.  Pt follows commands but unable to vocalize needs at this time.

## 2015-11-17 LAB — COMPREHENSIVE METABOLIC PANEL
ALBUMIN: 4.3 g/dL (ref 3.5–5.0)
ALT: 16 U/L — ABNORMAL LOW (ref 17–63)
AST: 15 U/L (ref 15–41)
Alkaline Phosphatase: 41 U/L (ref 38–126)
Anion gap: 7 (ref 5–15)
BILIRUBIN TOTAL: 0.5 mg/dL (ref 0.3–1.2)
BUN: 15 mg/dL (ref 6–20)
CHLORIDE: 105 mmol/L (ref 101–111)
CO2: 22 mmol/L (ref 22–32)
Calcium: 8.9 mg/dL (ref 8.9–10.3)
Creatinine, Ser: 1.22 mg/dL (ref 0.61–1.24)
GFR calc Af Amer: >60 mL/min (ref >60)
GLUCOSE: 95 mg/dL (ref 65–99)
POTASSIUM: 3.3 mmol/L — AB (ref 3.5–5.1)
Sodium: 134 mmol/L — ABNORMAL LOW (ref 135–145)
TOTAL PROTEIN: 7.6 g/dL (ref 6.5–8.1)

## 2015-11-17 LAB — CBC
HCT: 48 % (ref 40.0–52.0)
Hemoglobin: 16.5 g/dL (ref 13.0–18.0)
MCH: 33.3 pg (ref 26.0–34.0)
MCHC: 34.3 g/dL (ref 32.0–36.0)
MCV: 96.9 fL (ref 80.0–100.0)
PLATELETS: 210 10*3/uL (ref 150–440)
RBC: 4.95 MIL/uL (ref 4.40–5.90)
RDW: 13.1 % (ref 11.5–14.5)
WBC: 7.1 10*3/uL (ref 3.8–10.6)

## 2015-11-17 LAB — CK: CK TOTAL: 151 U/L (ref 49–397)

## 2015-11-17 MED ORDER — SODIUM CHLORIDE 0.9 % IV BOLUS (SEPSIS)
1000.0000 mL | Freq: Once | INTRAVENOUS | Status: AC
  Administered 2015-11-17: 1000 mL via INTRAVENOUS

## 2015-11-17 MED ORDER — SODIUM CHLORIDE 0.9 % IV BOLUS (SEPSIS): 1000.0000 mL | Freq: Once | INTRAVENOUS | Status: DC

## 2015-11-17 MED ORDER — SODIUM CHLORIDE 0.9 % IV SOLN
Freq: Once | INTRAVENOUS | Status: AC
  Administered 2015-11-17: via INTRAVENOUS

## 2015-11-17 MED ORDER — POTASSIUM CHLORIDE 20 MEQ PO PACK
40.0000 meq | PACK | Freq: Once | ORAL | Status: AC
  Administered 2015-11-17: 40 meq via ORAL
  Filled 2015-11-17: qty 2

## 2015-11-17 NOTE — ED Notes (Addendum)
PT sitting up in bed, appears to be more alert, able to answer questions in complete sentences. Family at bedside. Pt requesting something to eat. MD to be notified.

## 2015-11-17 NOTE — ED Provider Notes (Signed)
I assumed care of the patient at 12:00 AM from Dr. Shaune PollackLord. Patient initially appeared postictal not answering questions but does follow verbal commands. Laboratory data obtained 2 L normal saline administered. On reevaluation patient is now conversant stating that he is hungry and tolerated by mouth. Patient had baseline before discharge from the emergency department.  Darci Currentandolph N Brown, MD 11/17/15 [phone]

## 2015-11-17 NOTE — ED Notes (Signed)
Pt provided with Malaw[place]urkey sandwich and lemon lime soda per MD provider.

## 2015-11-17 NOTE — ED Notes (Signed)

## 2015-11-17 NOTE — ED Notes (Signed)
Pt sitting up in bed, drinking ginger ale.  Pt able to speak in high pitch voice.  Per pts mother, pts voice is normally falsetto after post seizure. Instructed pt to keep arm straight to allow fluids to flow quickly.

## 2016-06-07 ENCOUNTER — Observation Stay
Admission: EM | Admit: 2016-06-07 | Discharge: 2016-06-08 | Disposition: A | Discharge status: Home / Self Care | Payer: Medicaid Other | Attending: Internal Medicine | Admitting: Internal Medicine

## 2016-06-07 ENCOUNTER — Emergency Department: Payer: Medicaid Other

## 2016-06-07 DIAGNOSIS — J45909 Unspecified asthma, uncomplicated: Secondary | ICD-10-CM | POA: Diagnosis not present

## 2016-06-07 DIAGNOSIS — G40909 Epilepsy, unspecified, not intractable, without status epilepticus: Secondary | ICD-10-CM | POA: Diagnosis present

## 2016-06-07 DIAGNOSIS — Z825 Family history of asthma and other chronic lower respiratory diseases: Secondary | ICD-10-CM | POA: Diagnosis not present

## 2016-06-07 DIAGNOSIS — Z8249 Family history of ischemic heart disease and other diseases of the circulatory system: Secondary | ICD-10-CM | POA: Insufficient documentation

## 2016-06-07 DIAGNOSIS — Z79899 Other long term (current) drug therapy: Secondary | ICD-10-CM | POA: Diagnosis not present

## 2016-06-07 DIAGNOSIS — R4182 Altered mental status, unspecified: Secondary | ICD-10-CM | POA: Diagnosis not present

## 2016-06-07 DIAGNOSIS — R569 Unspecified convulsions: Secondary | ICD-10-CM

## 2016-06-07 HISTORY — DX: Unspecified convulsions: R56.9

## 2016-06-07 HISTORY — DX: Unspecified asthma, uncomplicated: J45.909

## 2016-06-07 LAB — URINALYSIS COMPLETE WITH MICROSCOPIC (ARMC ONLY)
Bilirubin Urine: NEGATIVE
Glucose, UA: 50 mg/dL — AB
Hgb urine dipstick: NEGATIVE
KETONES UR: NEGATIVE mg/dL
Leukocytes, UA: NEGATIVE
NITRITE: NEGATIVE
PROTEIN: NEGATIVE mg/dL
RBC / HPF: NONE SEEN RBC/hpf (ref 0–5)
Specific Gravity, Urine: 1.021 (ref 1.005–1.030)
pH: 7 (ref 5.0–8.0)

## 2016-06-07 LAB — BASIC METABOLIC PANEL
ANION GAP: 6 (ref 5–15)
BUN: 16 mg/dL (ref 6–20)
CO2: 24 mmol/L (ref 22–32)
Calcium: 9.3 mg/dL (ref 8.9–10.3)
Chloride: 109 mmol/L (ref 101–111)
Creatinine, Ser: 1.41 mg/dL — ABNORMAL HIGH (ref 0.61–1.24)
Glucose, Bld: 92 mg/dL (ref 65–99)
POTASSIUM: 3.8 mmol/L (ref 3.5–5.1)
SODIUM: 139 mmol/L (ref 135–145)

## 2016-06-07 LAB — CBC
HCT: 48 % (ref 40.0–52.0)
Hemoglobin: 16.3 g/dL (ref 13.0–18.0)
MCH: 33.2 pg (ref 26.0–34.0)
MCHC: 34 g/dL (ref 32.0–36.0)
MCV: 97.8 fL (ref 80.0–100.0)
PLATELETS: 208 10*3/uL (ref 150–440)
RBC: 4.91 MIL/uL (ref 4.40–5.90)
RDW: 12.8 % (ref 11.5–14.5)
WBC: 4.9 10*3/uL (ref 3.8–10.6)

## 2016-06-07 LAB — URINE DRUG SCREEN, QUALITATIVE (ARMC ONLY)
Amphetamines, Ur Screen: NOT DETECTED
Barbiturates, Ur Screen: NOT DETECTED
Benzodiazepine, Ur Scrn: NOT DETECTED
COCAINE METABOLITE, UR ~~LOC~~: NOT DETECTED
Cannabinoid 50 Ng, Ur ~~LOC~~: NOT DETECTED
MDMA (Ecstasy)Ur Screen: NOT DETECTED
Methadone Scn, Ur: NOT DETECTED
Opiate, Ur Screen: NOT DETECTED
Phencyclidine (PCP) Ur S: NOT DETECTED
TRICYCLIC, UR SCREEN: NOT DETECTED

## 2016-06-07 MED ORDER — ZONISAMIDE 100 MG PO CAPS
300.0000 mg | ORAL_CAPSULE | Freq: Every day | ORAL | Status: DC
  Administered 2016-06-07: 300 mg via ORAL
  Filled 2016-06-07: qty 3

## 2016-06-07 MED ORDER — ACETAMINOPHEN 325 MG PO TABS: 650.0000 mg | ORAL_TABLET | Freq: Four times a day (QID) | ORAL | Status: DC | PRN

## 2016-06-07 MED ORDER — ENOXAPARIN SODIUM 40 MG/0.4ML ~~LOC~~ SOLN: 40.0000 mg | Freq: Every day | SUBCUTANEOUS | Status: DC

## 2016-06-07 MED ORDER — ONDANSETRON HCL 4 MG PO TABS: 4.0000 mg | ORAL_TABLET | Freq: Four times a day (QID) | ORAL | Status: DC | PRN

## 2016-06-07 MED ORDER — ACETAMINOPHEN 650 MG RE SUPP: 650.0000 mg | Freq: Four times a day (QID) | RECTAL | Status: DC | PRN

## 2016-06-07 MED ORDER — MONTELUKAST SODIUM 10 MG PO TABS
10.0000 mg | ORAL_TABLET | Freq: Every day | ORAL | Status: DC
  Administered 2016-06-07: 23:00:00 10 mg via ORAL
  Filled 2016-06-07: qty 1

## 2016-06-07 MED ORDER — ALBUTEROL SULFATE (2.5 MG/3ML) 0.083% IN NEBU: 3.0000 mL | INHALATION_SOLUTION | Freq: Four times a day (QID) | RESPIRATORY_TRACT | Status: DC | PRN

## 2016-06-07 MED ORDER — ONDANSETRON HCL 4 MG/2ML IJ SOLN: 4.0000 mg | Freq: Four times a day (QID) | INTRAMUSCULAR | Status: DC | PRN

## 2016-06-07 NOTE — ED Notes (Signed)
Pt still not verbal, but understand instrucitoons.  Has urinated in urinal since him mom left a bit ago. To ct via strecther

## 2016-06-07 NOTE — ED Notes (Signed)
Pt is here with his mother, states she received a phone call from pt work that he was not acting himself. Mother states the pt has a hx of seizure where he does not respond and stairs off into space.. Pt is not responding verbally on arrival, pt does respond to pain, pt is falling you with his eyes.Isaac Vaughn. Respirations WNL, skin is warm and dry..Isaac Vaughn

## 2016-06-07 NOTE — ED Notes (Signed)
Pt nonverbal, following commands at time of shift change.

## 2016-06-07 NOTE — ED Provider Notes (Signed)
[facility] Emergency Department Provider Note   ____________________________________________  Time seen: Approximately 3:50 PM  I have reviewed the triage vital signs and the nursing notes.   HISTORY  Chief Complaint Seizures  History limited limited by patient being nonverbal at the present time  HPI Isaac Vaughn is a 25 y.o. male patient's mom gives a history that he has a history of [facility] usually he twitches and stares off into space his speech gets slurred and he stops talking. Mom reports it can be half an hour to an hour before he starts talking again. She was called because he was at work and acting funny so she came there and brought him to the emergency room. In the ER he is awake alert but not speaking and not following commands at this point. Mom reports this is common for him after seizure.  Past Medical History  Diagnosis Date  . Epilepsy (HCC)   . Seizures (HCC)   . Asthma     Patient Active Problem List   Diagnosis Date Noted  . Altered mental status 06/07/2016    History reviewed. No pertinent past surgical history.  No current outpatient prescriptions on file.  Allergies Review of patient's allergies indicates no known allergies.  Family History  Problem Relation Age of Onset  . Hypertension Mother   . Asthma Mother     Social History Social History  Substance Use Topics  . Smoking status: Never Smoker   . Smokeless tobacco: None  . Alcohol Use: No    Review of Systems Unavailable  ____________________________________________   PHYSICAL EXAM:  VITAL SIGNS: ED Triage Vitals  Enc Vitals Group     BP 06/07/16 1455 133/71 mmHg     Pulse Rate 06/07/16 1455 64     Resp 06/07/16 1455 18     Temp 06/07/16 1455 98.5 F (36.9 C)     Temp Source 06/07/16 1455 Oral     SpO2 06/07/16 1455 99 %     Weight 06/07/16 1455 220 lb (99.791 kg)     Height 06/07/16 1455 6' (1.829 m)     Head Cir --    Peak Flow --      Pain Score --      Pain Loc --      Pain Edu? --      Excl. in GC? --     Constitutional: Alert  Well appearing and in no acute distress. Eyes: Conjunctivae are normal. PERRL. EOMI. Head: Atraumatic. Nose: No congestion/rhinnorhea. Mouth/Throat: Mucous membranes are moist.  Oropharynx non-erythematous. Neck: No stridor.  Cardiovascular: Normal rate, regular rhythm. Grossly normal heart sounds.  Good peripheral circulation. Respiratory: Normal respiratory effort.  No retractions. Lungs CTAB. Gastrointestinal: Soft and nontender. No distention. No abdominal bruits. No CVA tenderness. Musculoskeletal: No lower extremity tenderness nor edema.  No joint effusions. Neurologic:  Normal speech and language. No gross focal neurologic deficits are appreciated. No gait instability. Skin:  Skin is warm, dry and intact. No rash noted.   ____________________________________________   LABS (all labs ordered are listed, but only abnormal results are displayed)  Labs Reviewed  URINALYSIS COMPLETEWITH MICROSCOPIC ([facility] ONLY) - Abnormal; Notable for the following:    Color, Urine YELLOW (*)    APPearance CLOUDY (*)    Glucose, UA 50 (*)    Bacteria, UA RARE (*)    Squamous Epithelial / LPF 0-5 (*)    All other components within normal limits  BASIC  METABOLIC PANEL - Abnormal; Notable for the following:    Creatinine, Ser 1.41 (*)    All other components within normal limits  URINE DRUG SCREEN, QUALITATIVE ([facility] ONLY)  CBC   ____________________________________________  EKG  EKG read and interpreted by me shows normal sinus rhythm. Normal axis and use ointment no acute changes ____________________________________________  RADIOLOGY  Head CT read as normal by radiology ____________________________________________   PROCEDURES    Procedures    ____________________________________________   INITIAL IMPRESSION / ASSESSMENT AND PLAN / ED COURSE  Pertinent  labs & imaging results that were available during my care of the patient were reviewed by me and considered in my medical decision making (see chart for details).  ----------------------------------------- 4:28 PM on 06/07/2016 -----------------------------------------  Pt still not able to move or speak. [facility] neuro contacted, reviews chart pt also w/ developmental delay. In 2014 only 2 seizures. Nothing new in'15. in'16 only an ER visit for chest pain & 1 seizure. zonegran 30 mg/day Would not ct unless does not  Return to baseline  Mom reports she gives him for his medicines herself. He has not missed any doses. She was called from work by his supervisor who said he was not acting right she told a Merchandiser, retailsupervisor and he was probably going to have a seizure and had him sit down and his brother sat with him until his mother arrived. When his mother arrived she was like this postictal acting. ____________________________________________   FINAL CLINICAL IMPRESSION(S) / ED DIAGNOSES  Final diagnoses:  Postictal state (HCC)      NEW MEDICATIONS STARTED DURING THIS VISIT:  Current Discharge Medication List       Note:  This document was prepared using Dragon voice recognition software and may include unintentional dictation errors.    Arnaldo NatalPaul F Malinda, MD 06/08/16 [phone]

## 2016-06-07 NOTE — H&P (Signed)
[facility] [facility] [place] [facility]   PATIENT NAME: Isaac Vaughn    MR#:  [id number]  DATE OF BIRTH:  01-12-1991  DATE OF ADMISSION:  06/07/2016  PRIMARY CARE PHYSICIAN: Mat CarneAnthony J Viera, MD   REQUESTING/REFERRING PHYSICIAN: Dr. Dorothea GlassmanPaul Malinda  CHIEF COMPLAINT:   Chief Complaint  Patient presents with  . Seizures    HISTORY OF PRESENT ILLNESS:  Isaac Gessyrone Hence  is a 25 y.o. male with a known history of Epilepsy, asthma who presents to the hospital due to altered mental status and after a seizure. Patient himself is currently altered therefore most of the history obtained from the mother at bedside. As per the mother patient was at work today at [employer] when they called her and told her that her son wasn't acting like himself today. Patient's mother says that he has been taking his seizure medications as she has been giving it to him. Patient apparently thought to have had a seizure at work today and therefore brought to the ER for further evaluation. Usually after his seizure status of mother patient has difficulty talking for about an hour but it has been a few hours now and he still nonverbal and not acting himself. Due to his persistent altered mental status and postictal state hospitalist services were contacted further treatment and evaluation.  PAST MEDICAL HISTORY:   Past Medical History  Diagnosis Date  . Epilepsy (HCC)   . Seizures (HCC)   . Asthma     PAST SURGICAL HISTORY:  History reviewed. No pertinent past surgical history.  SOCIAL HISTORY:   Social History  Substance Use Topics  . Smoking status: Never Smoker   . Smokeless tobacco: Not on file  . Alcohol Use: No    FAMILY HISTORY:   Family History  Problem Relation Age of Onset  . Hypertension Mother   . Asthma Mother     DRUG ALLERGIES:  No Known Allergies  REVIEW OF SYSTEMS:   Review of Systems  Unable to perform ROS: mental acuity  Neurological: Positive for seizures.     MEDICATIONS AT HOME:   Prior to Admission medications   Medication Sig Start Date End Date Taking? Authorizing Provider  albuterol (PROVENTIL HFA;VENTOLIN HFA) 108 (90 Base) MCG/ACT inhaler Inhale 2 puffs into the lungs every 6 (six) hours as needed for wheezing or shortness of breath.   Yes Historical Provider, MD  montelukast (SINGULAIR) 10 MG tablet Take 1 tablet by mouth daily. 05/19/15  Yes Historical Provider, MD  zonisamide (ZONEGRAN) 100 MG capsule Take 3 capsules by mouth daily.  06/21/15  Yes Historical Provider, MD      VITAL SIGNS:  Blood pressure 129/57, pulse 57, temperature 98.5 F (36.9 C), temperature source Oral, resp. rate 24, height 6' (1.829 m), weight 99.791 kg (220 lb), SpO2 97 %.  PHYSICAL EXAMINATION:  Physical Exam  GENERAL:  25 y.o.-year-old patient lying in the bed nonverbal but in no acute distress.  EYES: Pupils equal, round, reactive to light and accommodation. No scleral icterus. Extraocular muscles intact.  HEENT: Head atraumatic, normocephalic. Oropharynx and nasopharynx clear. No oropharyngeal erythema, moist oral mucosa  NECK:  Supple, no jugular venous distention. No thyroid enlargement, no tenderness.  LUNGS: Normal breath sounds bilaterally, no wheezing, rales, rhonchi. No use of accessory muscles of respiration.  CARDIOVASCULAR: S1, S2 RRR. No murmurs, rubs, gallops, clicks.  ABDOMEN: Soft, nontender, nondistended. Bowel sounds present. No organomegaly or mass.  EXTREMITIES: No pedal edema, cyanosis, or clubbing. + 2 pedal &  radial pulses b/l.   NEUROLOGIC: Cranial nerves II through XII are intact. No focal Motor or sensory deficits appreciated b/l.  Globally weak, non-verbal.  Follows simple commands PSYCHIATRIC: The patient is alert and oriented x 1.   SKIN: No obvious rash, lesion, or ulcer.   LABORATORY PANEL:   CBC No results for input(s): WBC, HGB, HCT, PLT in the last 168  hours. ------------------------------------------------------------------------------------------------------------------  Chemistries  No results for input(s): NA, K, CL, CO2, GLUCOSE, BUN, CREATININE, CALCIUM, MG, AST, ALT, ALKPHOS, BILITOT in the last 168 hours.  Invalid input(s): GFRCGP ------------------------------------------------------------------------------------------------------------------  Cardiac Enzymes No results for input(s): TROPONINI in the last 168 hours. ------------------------------------------------------------------------------------------------------------------  RADIOLOGY:  Ct Head Wo Contrast  06/07/2016  CLINICAL DATA:  Mental status changes. History of seizure. Verbally unresponsive. EXAM: CT HEAD WITHOUT CONTRAST TECHNIQUE: Contiguous axial images were obtained from the base of the skull through the vertex without intravenous contrast. COMPARISON:  MRI 07/05/2012.  Head CT 03/10/2012. FINDINGS: The brain has a normal appearance without evidence of malformation, atrophy, old or acute infarction, mass lesion, hemorrhage, hydrocephalus or extra-axial collection. The calvarium is unremarkable. The paranasal sinuses, middle ears and mastoids are clear. IMPRESSION: Normal head CT Electronically Signed   By: Paulina FusiMark  Shogry M.D.   On: 06/07/2016 18:06     IMPRESSION AND PLAN:   25 year old male with past medical history of epilepsy, asthma who presents to the hospital due to altered mental status.  1. Altered mental status-etiology unclear. Suspected to be secondary to seizures with a postictal state. -Patient's CT head is negative for any acute abnormality. As per the patient's mom usually after his seizure he is able to come around and be febrile within an hour or so. After this episode he continues to be nonverbal and altered and lethargic. -Continue seizure precautions, continue patient's son is some mild. I will get a neurology consult. -Patient possibly may need  EEG. I will check patient's electrolytes and basic labs.  2. History of seizures-continues on a semi-. -Continue care as mentioned above. Await neurology input.  3. Asthma-no acute exacerbation. -Continue albuterol inhaler as needed, continue Singulair.    All the records are reviewed and case discussed with ED provider. Management plans discussed with the patient, family and they are in agreement.  CODE STATUS: Full  TOTAL TIME TAKING CARE OF THIS PATIENT: 45 minutes.    Houston SirenSAINANI,Deshae Dickison J M.D on 06/07/2016 at 7:59 PM  Between 7am to 6pm - Pager - [phone]  After 6pm go to [email or web address] - password [email or web address] [facility]  [facility]  Office  [phone]  CC: Primary care physician; Mat CarneAnthony J Viera, MD

## 2016-06-08 DIAGNOSIS — R569 Unspecified convulsions: Secondary | ICD-10-CM

## 2016-06-08 MED ORDER — ZONISAMIDE 50 MG PO CAPS
50.0000 mg | ORAL_CAPSULE | Freq: Every day | ORAL | Status: DC
Start: 2016-06-08 — End: 2020-03-13

## 2016-06-08 MED ORDER — ZONISAMIDE 25 MG PO CAPS: 50.0000 mg | ORAL_CAPSULE | Freq: Every day | ORAL | Status: DC

## 2016-06-08 NOTE — Discharge Instructions (Addendum)
°  DIET:  Regular diet  DISCHARGE CONDITION:  Stable  ACTIVITY:  Activity as tolerated  OXYGEN:  Home Oxygen: No.   Oxygen Delivery: room air  DISCHARGE LOCATION:  home    ADDITIONAL DISCHARGE INSTRUCTION:no driving for six months, please contact primary neurologist as directed by dr. Thad Rangereynolds   If you experience worsening of your admission symptoms, develop shortness of breath, life threatening emergency, suicidal or homicidal thoughts you must seek medical attention immediately by calling 911 or calling your MD immediately  if symptoms less severe.  You Must read complete instructions/literature along with all the possible adverse reactions/side effects for all the Medicines you take and that have been prescribed to you. Take any new Medicines after you have completely understood and accpet all the possible adverse reactions/side effects.   Please note  You were cared for by a hospitalist during your hospital stay. If you have any questions about your discharge medications or the care you received while you were in the hospital after you are discharged, you can call the unit and asked to speak with the hospitalist on call if the hospitalist that took care of you is not available. Once you are discharged, your primary care physician will handle any further medical issues. Please note that NO REFILLS for any discharge medications will be authorized once you are discharged, as it is imperative that you return to your primary care physician (or establish a relationship with a primary care physician if you do not have one) for your aftercare needs so that they can reassess your need for medications and monitor your lab values.   Seizure, Adult A seizure means there is unusual activity in the brain. A seizure can cause changes in attention or behavior. Seizures often cause shaking (convulsions). Seizures often last from 30 seconds to 2 minutes. HOME CARE   If you are given medicines,  take them exactly as told by your doctor.  Keep all doctor visits as told.  Do not swim or drive until your doctor says it is okay.  Teach others what to do if you have a seizure. They should:  Lay you on the ground.  Put a cushion under your head.  Loosen any tight clothing around your neck.  Turn you on your side.  Stay with you until you get better. GET HELP RIGHT AWAY IF:   The seizure lasts longer than 2 to 5 minutes.  The seizure is very bad.  The person does not wake up after the seizure.  The person's attention or behavior changes. Drive the person to the emergency room or call your local emergency services (911 in [place] MAKE SURE YOU:   Understand these instructions.  Will watch your condition.  Will get help right away if you are not doing well or get worse.   This information is not intended to replace advice given to you by your health care provider. Make sure you discuss any questions you have with your health care provider.   Document Released: 05/07/2008 Document Revised: 02/11/2012 Document Reviewed: 07/01/2013 Elsevier Interactive Patient Education [employer]2016 Elsevier Inc.

## 2016-06-08 NOTE — Consult Note (Signed)
Reason for Consult:Seizure Referring Physician: Allena Katz  CC: Seizure  HPI: Isaac Vaughn is an 25 y.o. male with a known history of epilepsy, asthma who presents to the hospital after having multiple seizures.  Per the mother patient was at work at [employer] when they called her and told her that her son wasn't acting like himself today. She reports that when she got there he was having a seizure.  They proceeded to take him to the hospital and on the way he had antoher seizure.  Patient's mother says that he has been taking his seizure medications as she has been giving it to him.  Usually after his seizure patient has difficulty talking for about an hour (speech soft).  Patient did not return to baseline as usual and therefore was admitted.    Past Medical History  Diagnosis Date  . Epilepsy (HCC)   . Seizures (HCC)   . Asthma     History reviewed. No pertinent past surgical history.  Family History  Problem Relation Age of Onset  . Hypertension Mother   . Asthma Mother     Social History:  reports that he has never smoked. He does not have any smokeless tobacco history on file. He reports that he does not drink alcohol or use illicit drugs.  No Known Allergies  Medications:  I have reviewed the patient's current medications. Prior to Admission:  Prescriptions prior to admission  Medication Sig Dispense Refill Last Dose  . albuterol (PROVENTIL HFA;VENTOLIN HFA) 108 (90 Base) MCG/ACT inhaler Inhale 2 puffs into the lungs every 6 (six) hours as needed for wheezing or shortness of breath.   prn at prn  . montelukast (SINGULAIR) 10 MG tablet Take 1 tablet by mouth daily.  3 unknown at unknown  . zonisamide (ZONEGRAN) 100 MG capsule Take 3 capsules by mouth daily.   1 unknown at unknown   Scheduled: . enoxaparin (LOVENOX) injection  40 mg Subcutaneous QHS  . montelukast  10 mg Oral QHS  . zonisamide  300 mg Oral QHS  . zonisamide  50 mg Oral QHS    ROS: History obtained from  the patient  General ROS: negative for - chills, fatigue, fever, night sweats, weight gain or weight loss Psychological ROS: negative for - behavioral disorder, hallucinations, memory difficulties, mood swings or suicidal ideation Ophthalmic ROS: negative for - blurry vision, double vision, eye pain or loss of vision ENT ROS: negative for - epistaxis, nasal discharge, oral lesions, sore throat, tinnitus or vertigo Allergy and Immunology ROS: negative for - hives or itchy/watery eyes Hematological and Lymphatic ROS: negative for - bleeding problems, bruising or swollen lymph nodes Endocrine ROS: negative for - galactorrhea, hair pattern changes, polydipsia/polyuria or temperature intolerance Respiratory ROS: negative for - cough, hemoptysis, shortness of breath or wheezing Cardiovascular ROS: negative for - chest pain, dyspnea on exertion, edema or irregular heartbeat Gastrointestinal ROS: negative for - abdominal pain, diarrhea, hematemesis, nausea/vomiting or stool incontinence Genito-Urinary ROS: negative for - dysuria, hematuria, incontinence or urinary frequency/urgency Musculoskeletal ROS: muscles sore Neurological ROS: as noted in HPI Dermatological ROS: negative for rash and skin lesion changes  Physical Examination: Blood pressure 123/55, pulse 105, temperature 98.1 F (36.7 C), temperature source Oral, resp. rate 20, height 6' (1.829 m), weight 108.863 kg (240 lb), SpO2 95 %.  HEENT-  Normocephalic, no lesions, without obvious abnormality.  Normal external eye and conjunctiva.  Normal TM's bilaterally.  Normal auditory canals and external ears. Normal external nose, mucus membranes and  septum.  Normal pharynx. Cardiovascular- S1, S2 normal, pulses palpable throughout   Lungs- chest clear, no wheezing, rales, normal symmetric air entry Abdomen- soft, non-tender; bowel sounds normal; no masses,  no organomegaly Extremities- no edema Lymph-no adenopathy palpable Musculoskeletal-no  joint tenderness, deformity or swelling Skin-warm and dry, no hyperpigmentation, vitiligo, or suspicious lesions  Neurological Examination Mental Status: Alert.  Speech soft but fluent without evidence of aphasia.  Able to follow 3 step commands without difficulty. Cranial Nerves: II: Discs flat bilaterally; Visual fields grossly normal, pupils equal, round, reactive to light and accommodation III,IV, VI: ptosis not present, extra-ocular motions intact bilaterally V,VII: decrease in left NLF, facial light touch sensation normal bilaterally VIII: hearing normal bilaterally IX,X: gag reflex present XI: bilateral shoulder shrug XII: midline tongue extension Motor: Lifts upper extremities against gravity above his head with no focal weakness noted.  Does not lift his legs off the bed.   Sensory: Pinprick and light touch intact throughout, bilaterally Deep Tendon Reflexes: 2+ and symmetric with absent AJ's bilaterally Plantars: Right: downgoing   Left: downgoing Cerebellar: normal finger-to-nose testing Gait: not tested due to safety concerns    Laboratory Studies:   Basic Metabolic Panel:  Recent Labs Lab 06/07/16 1520  NA 139  K 3.8  CL 109  CO2 24  GLUCOSE 92  BUN 16  CREATININE 1.41*  CALCIUM 9.3    Liver Function Tests: No results for input(s): AST, ALT, ALKPHOS, BILITOT, PROT, ALBUMIN in the last 168 hours. No results for input(s): LIPASE, AMYLASE in the last 168 hours. No results for input(s): AMMONIA in the last 168 hours.  CBC:  Recent Labs Lab 06/07/16 1520  WBC 4.9  HGB 16.3  HCT 48.0  MCV 97.8  PLT 208    Cardiac Enzymes: No results for input(s): CKTOTAL, CKMB, CKMBINDEX, TROPONINI in the last 168 hours.  BNP: Invalid input(s): POCBNP  CBG: No results for input(s): GLUCAP in the last 168 hours.  Microbiology: No results found for this or any previous visit.  Coagulation Studies: No results for input(s): LABPROT, INR in the last 72  hours.  Urinalysis:  Recent Labs Lab 06/07/16 1749  COLORURINE YELLOW*  LABSPEC 1.021  PHURINE 7.0  GLUCOSEU 50*  HGBUR NEGATIVE  BILIRUBINUR NEGATIVE  KETONESUR NEGATIVE  PROTEINUR NEGATIVE  NITRITE NEGATIVE  LEUKOCYTESUR NEGATIVE    Lipid Panel:  No results found for: CHOL, TRIG, HDL, CHOLHDL, VLDL, LDLCALC  HgbA1C: No results found for: HGBA1C  Urine Drug Screen:     Component Value Date/Time   LABOPIA NONE DETECTED 06/07/2016 1749   COCAINSCRNUR NONE DETECTED 06/07/2016 1749   LABBENZ NONE DETECTED 06/07/2016 1749   AMPHETMU NONE DETECTED 06/07/2016 1749   THCU NONE DETECTED 06/07/2016 1749   LABBARB NONE DETECTED 06/07/2016 1749    Alcohol Level: No results for input(s): ETH in the last 168 hours.  Other results: EKG: sinus rhythm at 63 bpm.  Imaging: Ct Head Wo Contrast  06/07/2016  CLINICAL DATA:  Mental status changes. History of seizure. Verbally unresponsive. EXAM: CT HEAD WITHOUT CONTRAST TECHNIQUE: Contiguous axial images were obtained from the base of the skull through the vertex without intravenous contrast. COMPARISON:  MRI 07/05/2012.  Head CT 03/10/2012. FINDINGS: The brain has a normal appearance without evidence of malformation, atrophy, old or acute infarction, mass lesion, hemorrhage, hydrocephalus or extra-axial collection. The calvarium is unremarkable. The paranasal sinuses, middle ears and mastoids are clear. IMPRESSION: Normal head CT Electronically Signed   By: Scherrie BatemanMark  Shogry M.D.  On: 06/07/2016 18:06     Assessment/Plan: 25 year old male with a history of seizures on Zonisamide.  [facility]  Last seizure in November.  Zonisamide increased at that time.  Now with breakthrough seizures.  Post ictal phenomenon may have been prolonged due to multiplicity of seizures.  Patient is up quite a bit at night.  There may be some degree of sleep deprivation that is lowering his seizure threshold.  Although whispering, mother reports he is very close to  baseline.  Reports pain in his extremities interfering with strength.  Will evaluate for ability to ambulate.    Recommendations: 1.  Increase Zonisamide to 350mg  qhs 2.  PT to evaluate for ambulation 3.  Family to call for follow up with outpatient neurologist 4.  Seizure precautions  Thana FarrLeslie Jaquayla Hege, MD Neurology [phone] 06/08/2016, 9:56 AM

## 2016-06-08 NOTE — Progress Notes (Signed)
Pt for discharge home with  Mother. Alert. Responsive. No resp distress. No s/s seizure activity noted.   Pt was ambulated  In hall   And tol well.  Discharge instruction discussed with pts mother/ presc  Called to  pts pharmacy.  Other home meds discussed also. Diet / activity and f/u discussed. Pt tol regular diet well. Mom verbalizes  Understanding of  Discharge plans.

## 2016-06-08 NOTE — Plan of Care (Signed)
Problem: Education: Goal: Knowledge of [facility] Education information/materials will improve Outcome: Progressing Pt likes to be called Isaac Vaughn    Past Medical History   Diagnosis  Date   .  Epilepsy (HCC)     .  Seizures (HCC)     .  Asthma             Pt is well controlled with home medications

## 2016-06-08 NOTE — Discharge Summary (Signed)
Isaac Vaughn, 25 y.o., DOB 09/29/1991, MRN [id number]. Admission date: 06/07/2016 Discharge Date 06/08/2016 Primary MD Mat CarneAnthony J Viera, MD Admitting Physician Houston SirenVivek J Sainani, MD  Admission Diagnosis  seizures   Discharge Diagnosis   Active Problems:   Altered mental status due to postictal state  Seizure Asthma       Hospital CourseTyrone Judie PetitM Broadus Vaughn is an 25 y.o. male with a known history of epilepsy, asthma who presents to the hospital after having multiple seizures. Per the mother patient was at work at [employer] when they called her and told her that her son wasn't acting like himself today. She reports that when she got there he was having a seizure. They proceeded to take him to the hospital and on the way he had antoher seizure. Patient mother administers and medication and he is been getting his medications on regular time. He was seen in the ED for seizure and then admitted. Initially he was confused due to postictal state. Now he is back to baseline. He was seen by Dr. Thad Rangereynolds of neurology who recommended changing to his seizure medication. Which is currently changed. She did recommend the mother get in [facility] to notify of change in patient's Seizure  status. Patient currently is doing well and asymtomatic.           Consults  neurology  Significant Tests:  See full reports for all details    Ct Head Wo Contrast  06/07/2016  CLINICAL DATA:  Mental status changes. History of seizure. Verbally unresponsive. EXAM: CT HEAD WITHOUT CONTRAST TECHNIQUE: Contiguous axial images were obtained from the base of the skull through the vertex without intravenous contrast. COMPARISON:  MRI 07/05/2012.  Head CT 03/10/2012. FINDINGS: The brain has a normal appearance without evidence of malformation, atrophy, old or acute infarction, mass lesion, hemorrhage, hydrocephalus or extra-axial collection. The calvarium is unremarkable. The paranasal sinuses,  middle ears and mastoids are clear. IMPRESSION: Normal head CT Electronically Signed   By: Paulina FusiMark  Shogry M.D.   On: 06/07/2016 18:06       Today   Subjective:   Isaac Vaughn  feels well denies any complaints  Objective:   Blood pressure 146/76, pulse 62, temperature 97.9 F (36.6 C), temperature source Oral, resp. rate 18, height 6' (1.829 m), weight 108.863 kg (240 lb), SpO2 100 %.  .  Intake/Output Summary (Last 24 hours) at 06/08/16 1318 Last data filed at 06/07/16 2300  Gross per 24 hour  Intake      0 ml  Output      0 ml  Net      0 ml    Exam VITAL SIGNS: Blood pressure 146/76, pulse 62, temperature 97.9 F (36.6 C), temperature source Oral, resp. rate 18, height 6' (1.829 m), weight 108.863 kg (240 lb), SpO2 100 %.  GENERAL:  25 y.o.-year-old patient lying in the bed with no acute distress.  EYES: Pupils equal, round, reactive to light and accommodation. No scleral icterus. Extraocular muscles intact.  HEENT: Head atraumatic, normocephalic. Oropharynx and nasopharynx clear.  NECK:  Supple, no jugular venous distention. No thyroid enlargement, no tenderness.  LUNGS: Normal breath sounds bilaterally, no wheezing, rales,rhonchi or crepitation. No use of accessory muscles of respiration.  CARDIOVASCULAR: S1, S2 normal. No murmurs, rubs, or gallops.  ABDOMEN: Soft, nontender, nondistended. Bowel sounds present. No organomegaly or mass.  EXTREMITIES: No pedal edema, cyanosis, or clubbing.  NEUROLOGIC: Cranial nerves II through XII are intact. Muscle strength  5/5 in all extremities. Sensation intact. Gait not checked.  PSYCHIATRIC: The patient is alert and oriented x 3.  SKIN: No obvious rash, lesion, or ulcer.   Data Review     CBC w Diff: Lab Results  Component Value Date   WBC 4.9 06/07/2016   WBC 4.7 07/04/2012   HGB 16.3 06/07/2016   HGB 16.8 07/04/2012   HCT 48.0 06/07/2016   HCT 48.4 07/04/2012   PLT 208 06/07/2016   PLT 237 07/04/2012   LYMPHOPCT 32  07/10/2015   MONOPCT 8 07/10/2015   EOSPCT 2 07/10/2015   BASOPCT 0 07/10/2015   CMP: Lab Results  Component Value Date   NA 139 06/07/2016   NA 143 07/04/2012   K 3.8 06/07/2016   K 3.8 07/04/2012   CL 109 06/07/2016   CL 107 07/04/2012   CO2 24 06/07/2016   CO2 27 07/04/2012   BUN 16 06/07/2016   BUN 12 07/04/2012   CREATININE 1.41* 06/07/2016   CREATININE 1.18 07/04/2012   PROT 7.6 11/17/2015   PROT 7.8 07/04/2012   ALBUMIN 4.3 11/17/2015   ALBUMIN 3.9 07/04/2012   BILITOT 0.5 11/17/2015   BILITOT 0.3 07/04/2012   ALKPHOS 41 11/17/2015   ALKPHOS 70 07/04/2012   AST 15 11/17/2015   AST 20 07/04/2012   ALT 16* 11/17/2015   ALT 27 07/04/2012  .  Micro Results No results found for this or any previous visit (from the past 240 hour(s)).      Code Status Orders        Start     Ordered   06/07/16 2201  Full code   Continuous     06/07/16 2200    Code Status History    Date Active Date Inactive Code Status Order ID Comments User Context   This patient has a current code status but no historical code status.          Follow-up Information    Follow up with Mat CarneAnthony J Viera, MD In 7 days.   Specialty:  Family Medicine   Why:  Per Dr. office Patient or Family must scheldule appt.    Contact information:   [address] [place] [address] [phone]       Discharge Medications     Medication List    TAKE these medications        albuterol 108 (90 Base) MCG/ACT inhaler  Commonly known as:  PROVENTIL HFA;VENTOLIN HFA  Inhale 2 puffs into the lungs every 6 (six) hours as needed for wheezing or shortness of breath.     montelukast 10 MG tablet  Commonly known as:  SINGULAIR  Take 1 tablet by mouth daily.     zonisamide 100 MG capsule  Commonly known as:  ZONEGRAN  Take 3 capsules by mouth daily.     zonisamide 50 MG capsule  Commonly known as:  ZONEGRAN  Take 1 capsule (50 mg total) by mouth at bedtime.           Total Time  in preparing paper work, data evaluation and todays exam - 35 minutes  Auburn BilberryPATEL, Josue Falconi M.D on 06/08/2016 at 1:18 PM  [facility]   Office  [phone]

## 2017-12-11 ENCOUNTER — Emergency Department
Admission: EM | Admit: 2017-12-11 | Discharge: 2017-12-11 | Disposition: A | Discharge status: Home / Self Care | Payer: Medicaid Other | Attending: Emergency Medicine | Admitting: Emergency Medicine

## 2017-12-11 ENCOUNTER — Encounter: Payer: Self-pay | Admitting: Emergency Medicine

## 2017-12-11 ENCOUNTER — Other Ambulatory Visit: Payer: Self-pay

## 2017-12-11 ENCOUNTER — Emergency Department: Payer: Medicaid Other

## 2017-12-11 DIAGNOSIS — B9789 Other viral agents as the cause of diseases classified elsewhere: Secondary | ICD-10-CM | POA: Diagnosis not present

## 2017-12-11 DIAGNOSIS — J45909 Unspecified asthma, uncomplicated: Secondary | ICD-10-CM | POA: Diagnosis not present

## 2017-12-11 DIAGNOSIS — Z79899 Other long term (current) drug therapy: Secondary | ICD-10-CM | POA: Diagnosis not present

## 2017-12-11 DIAGNOSIS — J069 Acute upper respiratory infection, unspecified: Secondary | ICD-10-CM

## 2017-12-11 DIAGNOSIS — R05 Cough: Secondary | ICD-10-CM | POA: Diagnosis present

## 2017-12-11 LAB — INFLUENZA PANEL BY PCR (TYPE A & B)
Influenza A By PCR: NEGATIVE
Influenza B By PCR: NEGATIVE

## 2017-12-11 MED ORDER — MONTELUKAST SODIUM 10 MG PO TABS: 10.0000 mg | ORAL_TABLET | Freq: Every day | ORAL | 0 refills | Status: AC

## 2017-12-11 MED ORDER — BENZONATATE 100 MG PO CAPS: 200.0000 mg | ORAL_CAPSULE | Freq: Three times a day (TID) | ORAL | 0 refills | Status: DC | PRN

## 2017-12-11 MED ORDER — METHYLPREDNISOLONE 4 MG PO TBPK: ORAL_TABLET | ORAL | 0 refills | Status: DC

## 2017-12-11 MED ORDER — HYDROCOD POLST-CPM POLST ER 10-8 MG/5ML PO SUER
5.0000 mL | Freq: Once | ORAL | Status: AC
  Administered 2017-12-11: 5 mL via ORAL
  Filled 2017-12-11: qty 5

## 2017-12-11 MED ORDER — FEXOFENADINE-PSEUDOEPHED ER 60-120 MG PO TB12: 1.0000 | ORAL_TABLET | Freq: Two times a day (BID) | ORAL | 0 refills | Status: DC

## 2017-12-11 MED ORDER — BENZONATATE 100 MG PO CAPS: 200.0000 mg | ORAL_CAPSULE | Freq: Three times a day (TID) | ORAL | 0 refills | Status: AC | PRN

## 2017-12-11 NOTE — ED Provider Notes (Signed)
[facility] Emergency Department Provider Note   ____________________________________________   First MD Initiated Contact with Patient 12/11/17 1435     (approximate)  I have reviewed the triage vital signs and the nursing notes.   HISTORY  Chief Complaint URI    HPI Isaac Vaughn is a 27 y.o. male patient presents with cough, congestion, shortness of breath that began yesterday. Patient denies fever. Patient denies nausea ,vomiting, or diarrhea.Patient also complaining of decreased forced 5 and a scratchy throat. Patient able to tolerate foods and fluids without distress. No palliative measures for complaint.   Past Medical History:  Diagnosis Date  . Asthma   . Epilepsy (HCC)   . Seizures [facility](HCC)     Patient Active Problem List   Diagnosis Date Noted  . Altered mental status 06/07/2016    No past surgical history on file.  Prior to Admission medications   Medication Sig Start Date End Date Taking? Authorizing Provider  albuterol (PROVENTIL HFA;VENTOLIN HFA) 108 (90 Base) MCG/ACT inhaler Inhale 2 puffs into the lungs every 6 (six) hours as needed for wheezing or shortness of breath.    [provider]  benzonatate (TESSALON PERLES) 100 MG capsule Take 2 capsules (200 mg total) by mouth 3 (three) times daily as needed. 12/11/17 12/11/18  Joni ReiningSmith, Jabarie Pop K, PA-C  fexofenadine-pseudoephedrine (ALLEGRA-D) 60-120 MG 12 hr tablet Take 1 tablet by mouth 2 (two) times daily. 12/11/17   Joni ReiningSmith, Draper Gallon K, PA-C  methylPREDNISolone (MEDROL DOSEPAK) 4 MG TBPK tablet Take Tapered dose as directed 12/11/17   Joni ReiningSmith, Candid Bovey K, PA-C  montelukast (SINGULAIR) 10 MG tablet Take 1 tablet by mouth daily. 05/19/15   [provider]  zonisamide (ZONEGRAN) 100 MG capsule Take 3 capsules by mouth daily.  06/21/15   [provider]  zonisamide (ZONEGRAN) 50 MG capsule Take 1 capsule (50 mg total) by mouth at bedtime. 06/08/16   Auburn BilberryPatel, Shreyang, MD     Allergies Patient has no known allergies.  Family History  Problem Relation Age of Onset  . Hypertension Mother   . Asthma Mother     Social History Social History   Tobacco Use  . Smoking status: Never Smoker  Substance Use Topics  . Alcohol use: No    Alcohol/week: 0.0 oz  . Drug use: No    Review of Systems Constitutional: No fever/chills Eyes: No visual changes. ENT: Sore throat  Cardiovascular: Denies chest pain. Respiratory:  shortness of breath and productive cough Gastrointestinal: No abdominal pain.  No nausea, no vomiting.  No diarrhea.  No constipation. Genitourinary: Negative for dysuria. Musculoskeletal: Negative for back pain. Skin: Negative for rash. Neurological: Negative for headaches, focal weakness or numbness.   ____________________________________________   PHYSICAL EXAM:  VITAL SIGNS: ED Triage Vitals  Enc Vitals Group     BP 12/11/17 1412 (!) 145/83     Pulse Rate 12/11/17 1412 79     Resp 12/11/17 1412 18     Temp 12/11/17 1412 98.7 F (37.1 C)     Temp Source 12/11/17 1412 Oral     SpO2 12/11/17 1412 100 %     Weight 12/11/17 1413 237 lb (107.5 kg)     Height 12/11/17 1413 6' (1.829 m)     Head Circumference --      Peak Flow --      Pain Score --      Pain Loc --      Pain Edu? --  Excl. in GC? --    Constitutional: Alert and oriented. Well appearing and in no acute distress. Eyes: Conjunctivae are normal. PERRL. EOMI. Head: Atraumatic. Nose: Edematous nasal turbinates clear rhinorrhea. Mouth/Throat: Mucous membranes are moist.  Oropharynx non-erythematous. Neck: No stridor.  No cervical spine tenderness to palpation. Hematological/Lymphatic/Immunilogical: No cervical lymphadenopathy. Cardiovascular: Normal rate, regular rhythm. Grossly normal heart sounds.  Good peripheral circulation. Respiratory: Normal respiratory effort.  No retractions. Lungs CTAB. Neurologic:  Normal speech and language. No gross focal  neurologic deficits are appreciated. No gait instability. Skin:  Skin is warm, dry and intact. No rash noted. Psychiatric: Mood and affect are normal. Speech and behavior are normal.  ____________________________________________   LABS (all labs ordered are listed, but only abnormal results are displayed)  Labs Reviewed  INFLUENZA PANEL BY PCR (TYPE A & B)   ____________________________________________  EKG   ____________________________________________  RADIOLOGY  Dg Chest 2 View  Result Date: 12/11/2017 CLINICAL DATA:  Weakness, cough, chest congestion, and shortness of breath for while. Patient had onset of chest pain yesterday. History of asthma. EXAM: CHEST  2 VIEW COMPARISON:  Chest x-ray of March 10, 2012 FINDINGS: The lungs are adequately inflated. There is no focal infiltrate. There is no pleural effusion. The heart and pulmonary vascularity are normal. The mediastinum is normal in width. The bony thorax exhibits no acute abnormality. IMPRESSION: There is no acute cardiopulmonary abnormality. Electronically Signed   By: David  [place] M.D.   On: 12/11/2017 15:09    ____________________________________________   PROCEDURES  Procedure(s) performed: None  Procedures  Critical Care performed: No  ____________________________________________   INITIAL IMPRESSION / ASSESSMENT AND PLAN / ED COURSE  As part of my medical decision making, I reviewed the following data within the electronic MEDICAL RECORD NUMBER  Viral respiratory infection with cough. Discussed negative flu and chest x-ray finding with patient. Patient given discharge care instructions advised take medications directed. Patient advised follow-up with PCP complaint persists.        ____________________________________________   FINAL CLINICAL IMPRESSION(S) / ED DIAGNOSES  Final diagnoses:  Viral URI with cough     ED Discharge Orders        Ordered    benzonatate (TESSALON PERLES) 100 MG capsule   3 times daily PRN     12/11/17 1635    fexofenadine-pseudoephedrine (ALLEGRA-D) 60-120 MG 12 hr tablet  2 times daily     12/11/17 1635    methylPREDNISolone (MEDROL DOSEPAK) 4 MG TBPK tablet     12/11/17 1635       Note:  This document was prepared using Dragon voice recognition software and may include unintentional dictation errors.    Joni Reining, PA-C 12/11/17 1638    Emily Filbert, MD 12/17/17 [phone]

## 2017-12-11 NOTE — ED Triage Notes (Addendum)
Pt reports cough, congestion and sob that began yesterday. Denies fever. Denies NVD. Pt mother reports history of a "touch of asthma". Pt with noted hoarse voice, pt reports feeling scratchy throat that began yesterday. No apparent distress noted in triage.

## 2017-12-11 NOTE — ED Notes (Signed)
See triage note  Presents with a 1 week hx of cough which has been prod at times   Then last pm developed some chest soreness /discomfort  Pain increases with cough  Denies any fever

## 2019-02-08 ENCOUNTER — Emergency Department: Payer: Medicaid Other

## 2019-02-08 ENCOUNTER — Encounter (HOSPITAL_COMMUNITY): Payer: Self-pay | Admitting: Emergency Medicine

## 2019-02-08 ENCOUNTER — Other Ambulatory Visit: Payer: Self-pay

## 2019-02-08 ENCOUNTER — Emergency Department (HOSPITAL_COMMUNITY)
Admission: EM | Admit: 2019-02-08 | Discharge: 2019-02-08 | Disposition: A | Discharge status: Home / Self Care | Payer: Medicaid Other | Attending: Emergency Medicine | Admitting: Emergency Medicine

## 2019-02-08 ENCOUNTER — Emergency Department
Admission: EM | Admit: 2019-02-08 | Discharge: 2019-02-08 | Disposition: A | Discharge status: Other Acute Inpatient Hospital | Payer: Medicaid Other | Attending: Emergency Medicine | Admitting: Emergency Medicine

## 2019-02-08 DIAGNOSIS — G40909 Epilepsy, unspecified, not intractable, without status epilepticus: Secondary | ICD-10-CM | POA: Insufficient documentation

## 2019-02-08 DIAGNOSIS — R569 Unspecified convulsions: Secondary | ICD-10-CM

## 2019-02-08 DIAGNOSIS — J45909 Unspecified asthma, uncomplicated: Secondary | ICD-10-CM | POA: Diagnosis not present

## 2019-02-08 DIAGNOSIS — Z79899 Other long term (current) drug therapy: Secondary | ICD-10-CM | POA: Insufficient documentation

## 2019-02-08 LAB — CBC
HCT: 48.7 % (ref 39.0–52.0)
HEMOGLOBIN: 16.3 g/dL (ref 13.0–17.0)
MCH: 32.5 pg (ref 26.0–34.0)
MCHC: 33.5 g/dL (ref 30.0–36.0)
MCV: 97 fL (ref 80.0–100.0)
NRBC: 0 % (ref 0.0–0.2)
Platelets: 203 10*3/uL (ref 150–400)
RBC: 5.02 MIL/uL (ref 4.22–5.81)
RDW: 12.3 % (ref 11.5–15.5)
WBC: 4.9 10*3/uL (ref 4.0–10.5)

## 2019-02-08 LAB — BASIC METABOLIC PANEL
ANION GAP: 9 (ref 5–15)
BUN: 13 mg/dL (ref 6–20)
CHLORIDE: 107 mmol/L (ref 98–111)
CO2: 22 mmol/L (ref 22–32)
Calcium: 9.1 mg/dL (ref 8.9–10.3)
Creatinine, Ser: 1.29 mg/dL — ABNORMAL HIGH (ref 0.61–1.24)
GFR calc non Af Amer: >60 mL/min (ref >60)
Glucose, Bld: 97 mg/dL (ref 70–99)
POTASSIUM: 3.9 mmol/L (ref 3.5–5.1)
SODIUM: 138 mmol/L (ref 135–145)

## 2019-02-08 MED ORDER — PROCHLORPERAZINE EDISYLATE 10 MG/2ML IJ SOLN: 10.0000 mg | Freq: Once | INTRAMUSCULAR | Status: DC

## 2019-02-08 MED ORDER — SODIUM CHLORIDE 0.9 % IV BOLUS
1000.0000 mL | Freq: Once | INTRAVENOUS | Status: AC
  Administered 2019-02-08: 1000 mL via INTRAVENOUS

## 2019-02-08 MED ORDER — LORAZEPAM 2 MG/ML IJ SOLN
1.0000 mg | Freq: Once | INTRAMUSCULAR | Status: AC
  Administered 2019-02-08: 1 mg via INTRAVENOUS
  Filled 2019-02-08: qty 1

## 2019-02-08 MED ORDER — SODIUM CHLORIDE 0.9 % IV SOLN: 1500.0000 mg | INTRAVENOUS | Status: DC

## 2019-02-08 MED ORDER — LORAZEPAM 2 MG/ML IJ SOLN: 1.0000 mg | Freq: Once | INTRAMUSCULAR | Status: DC

## 2019-02-08 MED ORDER — LORAZEPAM 2 MG/ML IJ SOLN
1.0000 mg | Freq: Once | INTRAMUSCULAR | Status: AC
  Administered 2019-02-08: 1 mg via INTRAVENOUS

## 2019-02-08 MED ORDER — ACETAMINOPHEN 500 MG PO TABS
1000.0000 mg | ORAL_TABLET | Freq: Once | ORAL | Status: AC
  Administered 2019-02-08: 1000 mg via ORAL
  Filled 2019-02-08: qty 2

## 2019-02-08 MED ORDER — PHENYTOIN SODIUM 50 MG/ML IJ SOLN
1500.0000 mg | INTRAMUSCULAR | Status: AC
  Administered 2019-02-08: 1500 mg via INTRAVENOUS
  Filled 2019-02-08: qty 30

## 2019-02-08 MED ORDER — LORAZEPAM 2 MG/ML IJ SOLN
INTRAMUSCULAR | Status: AC
  Filled 2019-02-08: qty 1

## 2019-02-08 MED ORDER — KETOROLAC TROMETHAMINE 60 MG/2ML IM SOLN
15.0000 mg | Freq: Once | INTRAMUSCULAR | Status: AC
  Administered 2019-02-08: 15 mg via INTRAMUSCULAR
  Filled 2019-02-08: qty 2

## 2019-02-08 NOTE — Consult Note (Signed)
   [facility] Tele[facility] Consult [facility]  This patient was seen as a STAT consultation for recurrent seizure  Impression:  Breakthrough seizures Poor sleep Dehydration  Recommendations:  Currently somnolent but no focal findings or seizure like activity/automatisms per family at bedside Has not gotten any benzos/AEDs since arrival  If he does not wake up and feel closer to baseline in the next few hours, consider head imaging Can give home dose of Zonegran 350 qhs, or inc to 400 mg if family desires If not able to swallow, would give fosphenytoin (did not like Keppra in past) 20 mg/kg load and admit for EEG, neuro consultation   Otherwise as he slowly returns to baseline if appropriate for d/c can be discharged and followed up by his outpatient neurologist in [facility] Mom will call for an appointment if needed  D/w ED MD, call w questions ---------------------------------------------------------------------  CC: breakthrough seizure  History of Present Illness:  28 yo M hx epilepsy, asthma, felt overworked last night. Did not feel good yesterday, went to bed, but did not feel good this morning, did not eat.  He laid back down this morning, mom continued to check on him but noticed this morning he was talking out of his head.  She called 911 because he was having recurrent spells of lip smacking and eye twitching which is typical for his seizures.  These usually last less than 5 minutes and tired, confused afterwards  Since arrival has not had any further episodes since about 1530.  No AEDs or benzos have been given. Very somonolent but folllowing all commands straining for effort. Still nonverbal for some reason..   Diagnostic Testing: No imaging Cr 1.29 otherwise CBC BMP fine  Vital Signs:   reviewed Exam:  Mental Status:  Somnolent eyes closed but followingi all commands. Not v Naming: Intact Repetition: Intact   Speech: fluent  Cranial Nerves:  Pupils:  Equal round and reactive to light Extraocular movements: Intact in all cardinal gaze Ptosis: Absent Visual fields:unable Facial sensation: Intact light touch Facial movements: Intact and symmetric    Motor Exam:  No drift   Tremor/Abnormal Movements:  Resting tremor: Absent Intention tremor: Absent Postural tremor: Absent  Sensory Exam:   Light touch: Intact  Coordination:   Finger to nose: Intact   Medical Decision Making:  - Extensive number of diagnosis or management options are considered above.   - Extensive amount of complex data reviewed.   - High risk of complication and/or morbidity or mortality are associated with differential diagnostic considerations above.  - There may be uncertain outcome and increased probability of prolonged functional impairment or high probability of severe prolonged functional impairment associated with some of these differential diagnosis.   Medical Data Reviewed:  1.Data reviewed include clinical labs, radiology,  Medical Tests;   2.Tests results discussed w/performing or interpreting physician;   3.Obtaining/reviewing old medical records;  4.Obtaining case history from another source;  5.Independent review of image, tracing or specimen.    Patient was informed the Neurology Consult would happen via TeleHealth consult by way of interactive audio and video telecommunications, and consented to receiving care in this manner.

## 2019-02-08 NOTE — ED Notes (Signed)
EMTALA reviewed by this RN.  

## 2019-02-08 NOTE — Consult Note (Signed)
Neurology Consultation Reason for Consult: Seizures Referring Physician: Adela Lank, D  CC: Seizures  History is obtained from: Patient  HPI: Isaac Vaughn is a 28 y.o. male with a history of elective disability, seizures that started at age 52.  He has a history of prolonged postictal states with difficulty speaking after his seizures.  He had a seizure earlier today around 4:30 PM and due to prolonged postictal state has been transferred to [facility] for further evaluation.  On arrival, he continued to have some difficulty with speaking, though he would follow commands readily.  After I evaluated him, over the course of my evaluation I was able to coax him into saying that he " hurt all over" and that it felt like it was " in my bones."  He was able to count fingers and tell me "I do not know."  His last seizure was approximately 1 year ago.  His father is accompanying him, but unfortunately he has not witnessed 1 of his seizures.  He states that he has felt that he shakes all over.  ROS: A 14 point ROS was performed and is negative except as noted in the HPI.   Past Medical History:  Diagnosis Date  . Asthma   . Epilepsy (HCC)   . Seizures (HCC)      Family History  Problem Relation Age of Onset  . Hypertension Mother   . Asthma Mother      Social History:  reports that he has never smoked. He does not have any smokeless tobacco history on file. He reports that he does not drink alcohol or use drugs.   Exam: Current vital signs: Pulse 92   Temp 98.8 F (37.1 C) (Oral)   Resp 16   SpO2 99%  Vital signs in last 24 hours: Temp:  [98.1 F (36.7 C)-98.8 F (37.1 C)] 98.8 F (37.1 C) (03/08 2134) Pulse Rate:  [62-93] 92 (03/08 2134) Resp:  [13-28] 16 (03/08 2134) BP: (149-165)/(76-89) 152/76 (03/08 2033) SpO2:  [98 %-100 %] 99 % (03/08 2134) Weight:  [113.4 kg] 113.4 kg (03/08 1634)   Physical Exam  Constitutional: Appears well-developed and well-nourished.  Psych:  Affect appropriate to situation Eyes: No scleral injection HENT: No OP obstrucion Head: Normocephalic.  Cardiovascular: Normal rate and regular rhythm.  Respiratory: Effort normal, non-labored breathing GI: Soft.  No distension. There is no tenderness.  Skin: WDI  Neuro: Mental Status: Patient is awake, alert, he is able to follow commands readily, but has very little speech output.  When I asked him to say his name he says "T.. T.. T.." When I asked him to say "ah" he just breathily says it without phonating. Cranial Nerves: II: Visual Fields are full. Pupils are equal, round, and reactive to light.   III,IV, VI: EOMI without ptosis or diploplia.  He does have some end gaze nystagmus with she has left beating on leftward gaze and right beating on the right were gaze. V: Facial sensation is symmetric to temperature VII: Facial movement is symmetric.  VIII: hearing is intact to voice X: Uvula elevates symmetrically XI: Shoulder shrug is symmetric. XII: tongue is midline without atrophy or fasciculations.  Motor: Tone is normal. Bulk is normal. 5/5 strength was present in all four extremities.  He has very little strength until repeatedly encouraged which point his strength steadily improved to 5/5. Sensory: Sensation is symmetric to light touch. Cerebellar: FNF intact bilaterally  I have reviewed labs in epic and the results  pertinent to this consultation are: BMP-creatinine 1.29  I have reviewed the images obtained: CT head-unremarkable  Impression: 28 year old male with postictal aphasia.  Though true postictal aphasia(e.g. Todd's phenomenon) may be possible, my suspicion is that this represents embellishment given that he is able to follow commands readily, and once I do " give him permission" to speak he does say a few phrases which are internally fluent(e.g. " in my bones" was fluent once he started saying it.)  I do think would be reasonable to try a milligram of Ativan to see  if it does improve his speech.  If not, then may need observation with repeat EEG in the morning.  Recommendations: 1) Ativan 1 mg x 1 2) continue zonisamide 350 mg nightly 3) I would not add Dilantin to his regular medicines, but would have him call his outpatient neurologist for further recommendations.    Ritta Slot, MD [facility] [phone]  If 7pm- 7am, please page neurology on call as listed in [facility].

## 2019-02-08 NOTE — ED Triage Notes (Addendum)
Patient transferred fro [facility] ER by [facility]  , seizure episodes x2 today , received Ativan 1 mg prior to arrival , Alert but non verbal at arrival , will follow commands /respirations unlabored .

## 2019-02-08 NOTE — ED Notes (Signed)
Pt unable to complete stroke swallow screen due to not being oriented.

## 2019-02-08 NOTE — ED Provider Notes (Addendum)
[facility] EMERGENCY DEPARTMENT Provider Note   CSN: [id number] Arrival date & time: 02/08/19  2133    History   Chief Complaint Chief Complaint  Patient presents with  . Seizures    HPI Isaac Vaughn is a 28 y.o. male.     28 yo M with a cc of seizure like activity. Had two episodes today witnessed by family.  Prolonged post ictal period.  Sent from [place] for neuro eval.    Patient non verbal initially here, level V caveat AMS.     The history is provided by the patient.  Seizures  Seizure activity on arrival: yes   Seizure type:  Focal Initial focality:  None Episode characteristics: abnormal movements   Postictal symptoms: confusion and somnolence   Return to baseline: no   Severity:  Moderate Duration:  2 hours Timing:  Clustered Number of seizures this episode:  2 Progression:  Resolved Recent head injury:  No recent head injuries Meds prior to arrival: ativan and phosphenytoin. History of seizures: yes   Severity:  Moderate Seizure control level:  Well controlled Current therapy:  None   Past Medical History:  Diagnosis Date  . Asthma   . Epilepsy (HCC)   . Seizures [facility])     Patient Active Problem List   Diagnosis Date Noted  . Altered mental status 06/07/2016    History reviewed. No pertinent surgical history.      Home Medications    Prior to Admission medications   Medication Sig Start Date End Date Taking? Authorizing Provider  albuterol (PROVENTIL HFA;VENTOLIN HFA) 108 (90 Base) MCG/ACT inhaler Inhale 2 puffs into the lungs every 6 (six) hours as needed for wheezing or shortness of breath.    [provider]  albuterol (PROVENTIL) (2.5 MG/3ML) 0.083% nebulizer solution Inhale 3 mLs into the lungs every 4 (four) hours as needed for wheezing. 11/04/18 11/04/19  [provider]  methylPREDNISolone (MEDROL DOSEPAK) 4 MG TBPK tablet Take Tapered dose as directed Patient not taking: Reported on  02/08/2019 12/11/17   Joni Reining, PA-C  montelukast (SINGULAIR) 10 MG tablet Take 1 tablet (10 mg total) by mouth daily. 12/11/17   Joni Reining, PA-C  zonisamide (ZONEGRAN) 100 MG capsule Take 300 mg by mouth at bedtime. Take along with 50 mg tablet for total of 350 mg daily 06/21/15   [provider]  zonisamide (ZONEGRAN) 50 MG capsule Take 1 capsule (50 mg total) by mouth at bedtime. Patient taking differently: Take 50 mg by mouth at bedtime. Take along with 300 mg for total of 350 mg 06/08/16   Auburn Bilberry, MD    Family History Family History  Problem Relation Age of Onset  . Hypertension Mother   . Asthma Mother     Social History Social History   Tobacco Use  . Smoking status: Never Smoker  Substance Use Topics  . Alcohol use: No    Alcohol/week: 0.0 standard drinks  . Drug use: No     Allergies   Other   Review of Systems Review of Systems  Constitutional: Negative for chills and fever.  HENT: Negative for congestion and facial swelling.   Eyes: Negative for discharge and visual disturbance.  Respiratory: Negative for shortness of breath.   Cardiovascular: Negative for chest pain and palpitations.  Gastrointestinal: Negative for abdominal pain, diarrhea and vomiting.  Musculoskeletal: Negative for arthralgias and myalgias.  Skin: Negative for color change and rash.  Neurological: Positive for seizures. Negative  for tremors, syncope and headaches.  Psychiatric/Behavioral: Negative for confusion and dysphoric mood.     Physical Exam Updated Vital Signs BP (!) 149/80   Pulse 91   Temp 98.8 F (37.1 C) (Oral)   Resp (!) 28   SpO2 99%   Physical Exam Vitals signs and nursing note reviewed.  Constitutional:      Appearance: He is well-developed.  HENT:     Head: Normocephalic and atraumatic.  Eyes:     Pupils: Pupils are equal, round, and reactive to light.  Neck:     Musculoskeletal: Normal range of motion and neck supple.     Vascular:  No JVD.  Cardiovascular:     Rate and Rhythm: Normal rate and regular rhythm.     Heart sounds: No murmur. No friction rub. No gallop.   Pulmonary:     Effort: No respiratory distress.     Breath sounds: No wheezing.  Abdominal:     General: There is no distension.     Tenderness: There is no guarding or rebound.  Musculoskeletal: Normal range of motion.  Skin:    Coloration: Skin is not pale.     Findings: No rash.  Neurological:     Mental Status: He is alert and oriented to person, place, and time.     Comments: Delayed response moves all 4 extremities equally.  No noted weakness.  Extraocular motor intact.  Psychiatric:        Behavior: Behavior normal.      ED Treatments / Results  Labs (all labs ordered are listed, but only abnormal results are displayed) Labs Reviewed - No data to display  EKG None  Radiology Ct Head Wo Contrast  Result Date: 02/08/2019 CLINICAL DATA:  Two seizures today with prolonged postictal state. EXAM: CT HEAD WITHOUT CONTRAST TECHNIQUE: Contiguous axial images were obtained from the base of the skull through the vertex without intravenous contrast. COMPARISON:  06/07/2016 head CT. FINDINGS: Brain: No evidence of parenchymal hemorrhage or extra-axial fluid collection. No mass lesion, mass effect, or midline shift. No CT evidence of acute infarction. Cerebral volume is age appropriate. No ventriculomegaly. Vascular: No acute abnormality. Skull: No evidence of calvarial fracture. Sinuses/Orbits: The visualized paranasal sinuses are essentially clear. Other:  The mastoid air cells are unopacified. IMPRESSION: Negative head CT. No evidence of acute intracranial abnormality. Electronically Signed   By: Delbert Phenix M.D.   On: 02/08/2019 18:37    Procedures Procedures (including critical care time)  Medications Ordered in ED Medications  LORazepam (ATIVAN) injection 1 mg (1 mg Intravenous Given 02/08/19 2213)     Initial Impression / Assessment and  Plan / ED Course  I have reviewed the triage vital signs and the nursing notes.  Pertinent labs & imaging results that were available during my care of the patient were reviewed by me and considered in my medical decision making (see chart for details).        28 yo M with a chief complaint of seizure.  Patient had 2 focal seizures reportedly by family earlier today.  He was transferred here from [facility] for a prolonged postictal period after being loaded with fosphenytoin.  Patient nonverbal on my initial evaluation.  Discussed with Dr. Amada Jupiter evaluated at bedside.  Recommending 1 dose of Ativan and short observation here.  The patient is able to now have a conversation with me.  Family feels that he is getting steadily better.  We will have him follow-up with his neurologist as  an outpatient.  10:53 PM:  I have discussed the diagnosis/risks/treatment options with the patient and family and believe the pt to be eligible for discharge home to follow-up with PCP, neuro. We also discussed returning to the ED immediately if new or worsening sx occur. We discussed the sx which are most concerning (e.g., sudden worsening pain, fever, inability to tolerate by mouth) that necessitate immediate return. Medications administered to the patient during their visit and any new prescriptions provided to the patient are listed below.  Medications given during this visit Medications  LORazepam (ATIVAN) injection 1 mg (1 mg Intravenous Given 02/08/19 2213)     The patient appears reasonably screen and/or stabilized for discharge and I doubt any other medical condition or other [facility] requiring further screening, evaluation, or treatment in the ED at this time prior to discharge.    Final Clinical Impressions(s) / ED Diagnoses   Final diagnoses:  Seizure disorder [facility])    ED Discharge Orders    None       Melene Plan, DO 02/08/19 2243    Melene Plan, DO 02/08/19 2253

## 2019-02-08 NOTE — ED Triage Notes (Signed)
Pt arrived via [facility] from home. Hx of seizures, 2 focal seizures today with no LOC. Pt has been taking meds, last seizure was last summer. Family states that it can take about an hour before he is back to normal and talking. Pt is non verbal on ems arrival.

## 2019-02-08 NOTE — ED Notes (Signed)
Pt given ativan due to possible seizure. Pt staring off the left.

## 2019-02-08 NOTE — ED Provider Notes (Signed)
[facility] Emergency Department Provider Note ____________________________________________   First MD Initiated Contact with Patient 02/08/19 1637     (approximate)  I have reviewed the triage vital signs and the nursing notes.  HISTORY  Chief Complaint Seizures  EM caveat: Patient presents a phasic HPI Isaac Vaughn is a 28 y.o. male history of epilepsy and seizure disorder.  The patient presents for evaluation of 2 seizures today.  These were witnessed by family.  Is been very fatigued afterwards and EMS was called for further evaluation.    Past Medical History:  Diagnosis Date  . Asthma   . Epilepsy (HCC)   . Seizures [facility])     Patient Active Problem List   Diagnosis Date Noted  . Altered mental status 06/07/2016    History reviewed. No pertinent surgical history.  Prior to Admission medications   Medication Sig Start Date End Date Taking? Authorizing Provider  albuterol (PROVENTIL) (2.5 MG/3ML) 0.083% nebulizer solution Inhale 3 mLs into the lungs every 4 (four) hours as needed for wheezing. 11/04/18 11/04/19 Yes [provider]  montelukast (SINGULAIR) 10 MG tablet Take 1 tablet (10 mg total) by mouth daily. 12/11/17  Yes Joni Reining, PA-C  zonisamide (ZONEGRAN) 100 MG capsule Take 300 mg by mouth at bedtime. Take along with 50 mg tablet for total of 350 mg daily 06/21/15  Yes [provider]  zonisamide (ZONEGRAN) 50 MG capsule Take 1 capsule (50 mg total) by mouth at bedtime. Patient taking differently: Take 50 mg by mouth at bedtime. Take along with 300 mg for total of 350 mg 06/08/16  Yes Auburn Bilberry, MD  albuterol (PROVENTIL HFA;VENTOLIN HFA) 108 (90 Base) MCG/ACT inhaler Inhale 2 puffs into the lungs every 6 (six) hours as needed for wheezing or shortness of breath.    [provider]  methylPREDNISolone (MEDROL DOSEPAK) 4 MG TBPK tablet Take Tapered dose as directed Patient not taking: Reported on 02/08/2019  12/11/17   Joni Reining, PA-C    Allergies Other  Family History  Problem Relation Age of Onset  . Hypertension Mother   . Asthma Mother     Social History Social History   Tobacco Use  . Smoking status: Never Smoker  Substance Use Topics  . Alcohol use: No    Alcohol/week: 0.0 standard drinks  . Drug use: No    Review of Systems Family reports has been in normal health and had not complained of any medical issues until seizure occurred.  They report he does have a history of seizure disorder but is compliant with his medication Zonogram.    ____________________________________________   PHYSICAL EXAM:  VITAL SIGNS: ED Triage Vitals  Enc Vitals Group     BP --      Pulse --      Resp --      Temp 02/08/19 1643 98.6 F (37 C)     Temp Source 02/08/19 1643 Oral     SpO2 --      Weight 02/08/19 1634 250 lb (113.4 kg)     Height 02/08/19 1634 6' (1.829 m)     Head Circumference --      Peak Flow --      Pain Score --      Pain Loc --      Pain Edu? --      Excl. in GC? --     Constitutional: Alert with eyes open and tracks examiner.  He will nod his  head yes and no appropriately to questions. Eyes: Conjunctivae are normal. Head: Atraumatic. Nose: No congestion/rhinnorhea. Mouth/Throat: Mucous membranes are moist. Neck: No stridor.  Cardiovascular: Normal rate, regular rhythm. Grossly normal heart sounds.  Good peripheral circulation. Respiratory: Normal respiratory effort.  No retractions. Lungs CTAB. Gastrointestinal: Soft and nontender. No distention. Musculoskeletal: No lower extremity tenderness nor edema. Neurologic: Patient is able to participate in exam and does not any focal deficits but is just very fatigued and weak in all extremities.  He seems to have some psychomotor slowing.  There is no tremulousness seizure activity or eye deviation.  He tracks examiner well.  Family reports he has had seizures and is had very long states like this  afterwards Skin:  Skin is warm, dry and intact. No rash noted. Psychiatric: Mood and affect are calm and flat ____________________________________________   LABS (all labs ordered are listed, but only abnormal results are displayed)  Labs Reviewed  BASIC METABOLIC PANEL - Abnormal; Notable for the following components:      Result Value   Creatinine, Ser 1.29 (*)    All other components within normal limits  CBC  CBG MONITORING, ED  CBG MONITORING, ED   ____________________________________________  EKG  Reviewed entered by me at 1640 Heart rate 70 QRS 90 QTc 430 Normal sinus rhythm no evidence of acute ischemia ____________________________________________  RADIOLOGY  Ct Head Wo Contrast  Result Date: 02/08/2019 CLINICAL DATA:  Two seizures today with prolonged postictal state. EXAM: CT HEAD WITHOUT CONTRAST TECHNIQUE: Contiguous axial images were obtained from the base of the skull through the vertex without intravenous contrast. COMPARISON:  06/07/2016 head CT. FINDINGS: Brain: No evidence of parenchymal hemorrhage or extra-axial fluid collection. No mass lesion, mass effect, or midline shift. No CT evidence of acute infarction. Cerebral volume is age appropriate. No ventriculomegaly. Vascular: No acute abnormality. Skull: No evidence of calvarial fracture. Sinuses/Orbits: The visualized paranasal sinuses are essentially clear. Other:  The mastoid air cells are unopacified. IMPRESSION: Negative head CT. No evidence of acute intracranial abnormality. Electronically Signed   By: Delbert Phenix M.D.   On: 02/08/2019 18:37   ____________________________________________  PROCEDURES  Procedure(s) performed: None  Procedures  Critical Care performed: Yes, see critical care note(s)   CRITICAL CARE Performed by: Sharyn Creamer   Total critical care time: 34 minutes  Critical care time was exclusive of separately billable procedures and treating other patients.  Critical care was  necessary to treat or prevent imminent or life-threatening deterioration.  Critical care was time spent personally by me on the following activities: development of treatment plan with patient and/or surrogate as well as nursing, discussions with consultants, evaluation of patient's response to treatment, examination of patient, obtaining history from patient or surrogate, ordering and performing treatments and interventions, ordering and review of laboratory studies, ordering and review of radiographic studies, pulse oximetry and re-evaluation of patient's condition.  ____________________________________________  INITIAL IMPRESSION / ASSESSMENT AND PLAN / ED COURSE  Pertinent labs & imaging results that were available during my care of the patient were reviewed by me and considered in my medical decision making (see chart for details).   Patient presents for evaluation of seizures.  History slightly unclear as the patient is unable to provide.  Does have a history of prolonged postictal states with any phasic component which appears to be what he is demonstrating now.  Clinical Course as of Feb 08 2243  Wynelle Link Feb 08, 2019  1643 Patient is alert.  He follows commands.  He is however nonverbal, but in no distress.  No ongoing seizure-like activity.  Follows commands and tracks examiner well, just cannot currently speak.  He does have a known history of a prolonged postictal state which I suspect this is.  We will continue to monitor him closely.  Page neurology to discuss medications and clinical history   [MQ]  1852 Discussed with neuro (Dr. Tia Alert), agrees with current loading and care. Recommends ED to ED transfer and will consult upon patient at ED.    [MQ]    Clinical Course User Index [MQ] Sharyn Creamer, MD    540p. Discussed with Teleneurology MD. Advises at present no signs of seizure at present and no focul defecits. Would continue to observe until mental status normalizes. If not better  (back to baseline) within about 2 hours then would consider inpatient obs. However, has history of long post-ictal states.   Patient did have a brief event lasting less than 5 minutes with a fixed deviation of gaze to the left side.  This was also accompanied by a decrease in his responsiveness to commands.  He was given 1 mg of Ativan and his symptoms resolved within about a minute.  On reevaluation about 3 to 4 minutes afterwards he is now following again with his eyes, able to follow commands but very slow psychomotor response but moving all extremities to command.  I suspect he may have had recurrent seizure  ----------------------------------------- 8:35 PM on 02/08/2019 -----------------------------------------  Patient resting, alert tracks with eyes and follows commands including lifting arms up on both sides without any tremulousness.  Patient stable for transfer going to [facility] has received Dilantin loading  Patient accepted to [facility] by Dr. Lianne Moris all of emergency department.  Will be seen by neurology I spoke with neurologist who is in agreement with plan for transfer for neuro evaluation and possible need for EEG.  ____________________________________________   FINAL CLINICAL IMPRESSION(S) / ED DIAGNOSES  Final diagnoses:  Seizures (HCC)        Note:  This document was prepared using Dragon voice recognition software and may include unintentional dictation errors       Sharyn Creamer, MD 02/08/19 2245

## 2019-02-08 NOTE — ED Notes (Signed)
Report given to [facility]. Preparing pt for transport. Iv fluids infusing.

## 2019-02-10 ENCOUNTER — Emergency Department
Admission: EM | Admit: 2019-02-10 | Discharge: 2019-02-10 | Disposition: A | Discharge status: Home / Self Care | Payer: Medicaid Other | Attending: Emergency Medicine | Admitting: Emergency Medicine

## 2019-02-10 ENCOUNTER — Encounter: Payer: Self-pay | Admitting: Emergency Medicine

## 2019-02-10 DIAGNOSIS — J45909 Unspecified asthma, uncomplicated: Secondary | ICD-10-CM | POA: Insufficient documentation

## 2019-02-10 DIAGNOSIS — Z79899 Other long term (current) drug therapy: Secondary | ICD-10-CM | POA: Diagnosis not present

## 2019-02-10 DIAGNOSIS — R569 Unspecified convulsions: Secondary | ICD-10-CM | POA: Diagnosis present

## 2019-02-10 LAB — COMPREHENSIVE METABOLIC PANEL
ALBUMIN: 3.9 g/dL (ref 3.5–5.0)
ALT: 16 U/L (ref 0–44)
AST: 20 U/L (ref 15–41)
Alkaline Phosphatase: 39 U/L (ref 38–126)
Anion gap: 6 (ref 5–15)
BUN: 9 mg/dL (ref 6–20)
CO2: 21 mmol/L — ABNORMAL LOW (ref 22–32)
Calcium: 8.7 mg/dL — ABNORMAL LOW (ref 8.9–10.3)
Chloride: 111 mmol/L (ref 98–111)
Creatinine, Ser: 1.04 mg/dL (ref 0.61–1.24)
GFR calc Af Amer: >60 mL/min (ref >60)
GFR calc non Af Amer: >60 mL/min (ref >60)
GLUCOSE: 98 mg/dL (ref 70–99)
Potassium: 4.5 mmol/L (ref 3.5–5.1)
SODIUM: 138 mmol/L (ref 135–145)
Total Bilirubin: 1 mg/dL (ref 0.3–1.2)
Total Protein: 7.3 g/dL (ref 6.5–8.1)

## 2019-02-10 LAB — URINE DRUG SCREEN, QUALITATIVE (ARMC ONLY)
AMPHETAMINES, UR SCREEN: NOT DETECTED
BENZODIAZEPINE, UR SCRN: NOT DETECTED
Barbiturates, Ur Screen: NOT DETECTED
Cannabinoid 50 Ng, Ur ~~LOC~~: NOT DETECTED
Cocaine Metabolite,Ur ~~LOC~~: NOT DETECTED
MDMA (Ecstasy)Ur Screen: NOT DETECTED
Methadone Scn, Ur: NOT DETECTED
OPIATE, UR SCREEN: NOT DETECTED
Phencyclidine (PCP) Ur S: NOT DETECTED
Tricyclic, Ur Screen: NOT DETECTED

## 2019-02-10 LAB — URINALYSIS, COMPLETE (UACMP) WITH MICROSCOPIC
Bacteria, UA: NONE SEEN
Bilirubin Urine: NEGATIVE
Glucose, UA: NEGATIVE mg/dL
Hgb urine dipstick: NEGATIVE
Ketones, ur: NEGATIVE mg/dL
LEUKOCYTE UA: NEGATIVE
Nitrite: NEGATIVE
Protein, ur: NEGATIVE mg/dL
Specific Gravity, Urine: 1.006 (ref 1.005–1.030)
Squamous Epithelial / HPF: NONE SEEN (ref 0–5)
pH: 7 (ref 5.0–8.0)

## 2019-02-10 LAB — CBC
HCT: 44.9 % (ref 39.0–52.0)
HEMOGLOBIN: 15.2 g/dL (ref 13.0–17.0)
MCH: 32.7 pg (ref 26.0–34.0)
MCHC: 33.9 g/dL (ref 30.0–36.0)
MCV: 96.6 fL (ref 80.0–100.0)
Platelets: 185 10*3/uL (ref 150–400)
RBC: 4.65 MIL/uL (ref 4.22–5.81)
RDW: 12.4 % (ref 11.5–15.5)
WBC: 4.1 10*3/uL (ref 4.0–10.5)
nRBC: 0 % (ref 0.0–0.2)

## 2019-02-10 LAB — ETHANOL: Alcohol, Ethyl (B): <10 mg/dL (ref <10)

## 2019-02-10 MED ORDER — ONDANSETRON HCL 4 MG/2ML IJ SOLN
4.0000 mg | Freq: Once | INTRAMUSCULAR | Status: AC
  Administered 2019-02-10: 4 mg via INTRAVENOUS
  Filled 2019-02-10: qty 2

## 2019-02-10 MED ORDER — SODIUM CHLORIDE 0.9 % IV BOLUS
1000.0000 mL | Freq: Once | INTRAVENOUS | Status: AC
  Administered 2019-02-10: 1000 mL via INTRAVENOUS

## 2019-02-10 MED ORDER — LORAZEPAM 2 MG/ML IJ SOLN
1.0000 mg | Freq: Once | INTRAMUSCULAR | Status: AC
  Administered 2019-02-10: 1 mg via INTRAVENOUS
  Filled 2019-02-10: qty 1

## 2019-02-10 MED ORDER — ZONISAMIDE 100 MG PO CAPS
400.00 | ORAL_CAPSULE | ORAL | Status: DC
Start: ? — End: 2019-02-10

## 2019-02-10 MED ORDER — LEVETIRACETAM IN NACL 1500 MG/100ML IV SOLN
1500.0000 mg | Freq: Once | INTRAVENOUS | Status: AC
  Administered 2019-02-10: 1500 mg via INTRAVENOUS
  Filled 2019-02-10: qty 100

## 2019-02-10 NOTE — ED Notes (Signed)
Mother states O2 is helping pt breath easier. NAD pt resting comfortably

## 2019-02-10 NOTE — ED Provider Notes (Signed)
[facility] Emergency Department Provider Note  Time seen: 7:53 PM  I have reviewed the triage vital signs and the nursing notes.   HISTORY  Chief Complaint Seizures (Hx of focal seizure.)   HPI Isaac Vaughn is a 28 y.o. male with a past medical history of epilepsy seizure disorder who presents to the emergency department for a possible seizure at home.  Per mom the patient was seen initially at [facility] several days ago was transferred over to [facility] and was ultimately discharged home after negative work-up and the patient came out of his postictal state.  Yesterday patient had another seizure and went to [facility] emergency department where the patient once again came out of his postictal state was discharged home with medication adjustments including increased zonisamide, and added Keppra.  Patient had not yet started these new medications and had a another seizure today.  Mom describes the seizure as the patient staring off into the distance and occasionally will have blinking of his eyes.  States every time he has when these episodes he has a prolonged postictal period in which he does not speak.  Upon arrival to the emergency department patient is lying in bed appears comfortable, eyes are closed and the patient is not speaking when asked questions.  Not answering questions.   Past Medical History:  Diagnosis Date  . Asthma   . Epilepsy (HCC)   . Seizures [facility])     Patient Active Problem List   Diagnosis Date Noted  . Altered mental status 06/07/2016    History reviewed. No pertinent surgical history.  Prior to Admission medications   Medication Sig Start Date End Date Taking? Authorizing Provider  albuterol (PROVENTIL HFA;VENTOLIN HFA) 108 (90 Base) MCG/ACT inhaler Inhale 2 puffs into the lungs every 6 (six) hours as needed for wheezing or shortness of breath.    [provider]  albuterol (PROVENTIL) (2.5 MG/3ML) 0.083% nebulizer  solution Inhale 3 mLs into the lungs every 4 (four) hours as needed for wheezing. 11/04/18 11/04/19  [provider]  methylPREDNISolone (MEDROL DOSEPAK) 4 MG TBPK tablet Take Tapered dose as directed Patient not taking: Reported on 02/08/2019 12/11/17   Joni Reining, PA-C  montelukast (SINGULAIR) 10 MG tablet Take 1 tablet (10 mg total) by mouth daily. 12/11/17   Joni Reining, PA-C  zonisamide (ZONEGRAN) 100 MG capsule Take 300 mg by mouth at bedtime. Take along with 50 mg tablet for total of 350 mg daily 06/21/15   [provider]  zonisamide (ZONEGRAN) 50 MG capsule Take 1 capsule (50 mg total) by mouth at bedtime. Patient taking differently: Take 50 mg by mouth at bedtime. Take along with 300 mg for total of 350 mg 06/08/16   Auburn Bilberry, MD    Allergies  Allergen Reactions  . Other Other (See Comments)    Seasonal allergies: runny nose, congestion, watery eyes, sneezing    Family History  Problem Relation Age of Onset  . Hypertension Mother   . Asthma Mother     Social History Social History   Tobacco Use  . Smoking status: Never Smoker  Substance Use Topics  . Alcohol use: No    Alcohol/week: 0.0 standard drinks  . Drug use: No    Review of Systems Unable to obtain adequate review of systems due to participation and postictal state. ____________________________________________   PHYSICAL EXAM:  VITAL SIGNS: ED Triage Vitals  Enc Vitals Group     BP 02/10/19 1640  123/80     Pulse Rate 02/10/19 1640 (!) 18     Resp 02/10/19 1640 18     Temp 02/10/19 1640 97.9 F (36.6 C)     Temp Source 02/10/19 1640 Oral     SpO2 02/10/19 1640 99 %     Weight 02/10/19 1643 249 lb 1.9 oz (113 kg)     Height 02/10/19 1643 6' (1.829 m)     Head Circumference --      Peak Flow --      Pain Score 02/10/19 1642 0     Pain Loc --      Pain Edu? --      Excl. in GC? --     Constitutional: Patient is lying in bed with his eyes closed, not answering questions or  following commands. Eyes: When eyes are manually opened patient looks upward, however corneal reflex remain strong and intact bilaterally. ENT   Head: Normocephalic and atraumatic.   Mouth/Throat: Mucous membranes are moist. Cardiovascular: Normal rate, regular rhythm. Respiratory: Normal respiratory effort without tachypnea nor retractions. Breath sounds are clear  Gastrointestinal: Soft and nontender. No distention.  Musculoskeletal: Nontender with normal range of motion in all extremities Neurologic: Patient is lying in bed, no acute distress, however he does appear to occasionally move his extremities.  Is still not speaking and not following commands. Skin:  Skin is warm, dry and intact.  Psychiatric: Mood and affect are normal.   ____________________________________________   INITIAL IMPRESSION / ASSESSMENT AND PLAN / ED COURSE  Pertinent labs & imaging results that were available during my care of the patient were reviewed by me and considered in my medical decision making (see chart for details).  Patient presents emergency department after a seizure at home prior to arrival.  This is the patient's third seizure over the past several days.  Mom states they are always the same where he will stare off occasionally with blinking and then does not talk for many hours after his seizure.  Differential this time would include epileptic seizure, non-epileptiform seizure, metabolic abnormality, substance use.  Overall patient appears very well.  We will check labs and continue to closely monitor.  Mom states these episodes usually occur in times of stress or being overworked.  I was called to bedside as mom states the patient was having another seizure.  With the seizure the patient was shaking his right arm and had twitching and blinking of both eyes and face.  Continued to be unable to speak.  However patient continues have brisk and reactive corneal reflexes, makes purposeful movements  at times.  Vitals remain largely normal throughout event.  It is not entirely clear if the patient is having focal/partial seizures versus non-epileptiform seizure-like activity.  Patient has an appointment at 9 AM with his neurologist.  We will dose a small dose of Ativan and loaded with Keppra as the patient had not yet picked up his new medications including Keppra.  Patient's labs are largely reassuring, normal white blood cell count.  Patient appears much better at this time.  He is opening his eyes, looking around.  Mom states he was talking to her however during my evaluation he is still refusing to talk to me or answer questions.  We will continue to closely monitor, but I anticipate patient will be discharged home once he is back to his baseline and will follow-up with neurology tomorrow morning at 9:00 as scheduled.  Family is agreeable to plan of care.  Patient care signed out to Dr. Alphonzo Lemmings.  ____________________________________________   FINAL CLINICAL IMPRESSION(S) / ED DIAGNOSES  Seizure-like activity   Minna Antis, MD 02/10/19 2036

## 2019-02-10 NOTE — ED Notes (Signed)
Drank cup of ginger ale

## 2019-02-10 NOTE — Discharge Instructions (Signed)
As we discussed please begin taking your medications as prescribed at [facility].  Please follow-up with your neurologist at 9 AM as scheduled.  Return to the emergency department for any further seizure-like activity or for any other symptom personally concerning to yourself.

## 2019-02-10 NOTE — ED Notes (Signed)
Pt complaining to mom of difficulty breathing. All VSS resp even and unlabored. Discussed with mom that it maybe the ativan making his chest fell heavy. 2L O2 placed for comfort.

## 2019-02-10 NOTE — ED Triage Notes (Signed)
Pt has hx of focal seizures, presenting here today with 2 since discharge from [facility] last night. Pt is non-verbal on arrival and in NAD. Seizure lasting approximately 5 minutes.

## 2019-02-10 NOTE — ED Notes (Signed)
Pt needing to urinate, given urinal and privacy curtain by ed staff.

## 2019-02-10 NOTE — ED Notes (Signed)
Pharmacy contacted regarding medication. Pharmacy to send medication. Urine cup given to pt to collect urine sample. Pt able to follow commands and understand others at this time, but unable to speak at this time. Pt was able to shake head yes and no when assessing pt.

## 2019-02-10 NOTE — ED Notes (Signed)
Pt talking and alert. Sat up on side of bed and walked in room. Mom and pt state ready for discharge

## 2019-02-10 NOTE — ED Provider Notes (Signed)
-----------------------------------------   11:32 PM on 02/10/2019 -----------------------------------------  Patient awake and alert at baseline, family preferred discharge than admission, they have close outpatient follow-up tomorrow, he has been loaded with medications.  According to Dr. Awanda Mink plan, when he wakes up he is to be discharged using Dr. Awanda Mink discharge instructions which we have done.  Patient tolerated p.o. and family very comfortable with this plan.   Jeanmarie Plant, MD 02/10/19 [phone]

## 2019-02-12 ENCOUNTER — Emergency Department
Admission: EM | Admit: 2019-02-12 | Discharge: 2019-02-12 | Disposition: A | Discharge status: Home / Self Care | Payer: Medicaid Other | Attending: Student in an Organized Health Care Education/Training Program | Admitting: Student in an Organized Health Care Education/Training Program

## 2019-02-12 DIAGNOSIS — Z79899 Other long term (current) drug therapy: Secondary | ICD-10-CM | POA: Diagnosis not present

## 2019-02-12 DIAGNOSIS — J45909 Unspecified asthma, uncomplicated: Secondary | ICD-10-CM | POA: Diagnosis not present

## 2019-02-12 DIAGNOSIS — R569 Unspecified convulsions: Secondary | ICD-10-CM | POA: Diagnosis present

## 2019-02-12 LAB — BASIC METABOLIC PANEL
Anion gap: 7 (ref 5–15)
BUN: 8 mg/dL (ref 6–20)
CO2: 21 mmol/L — AB (ref 22–32)
Calcium: 8.6 mg/dL — ABNORMAL LOW (ref 8.9–10.3)
Chloride: 113 mmol/L — ABNORMAL HIGH (ref 98–111)
Creatinine, Ser: 1.15 mg/dL (ref 0.61–1.24)
GFR calc Af Amer: >60 mL/min (ref >60)
GFR calc non Af Amer: >60 mL/min (ref >60)
Glucose, Bld: 92 mg/dL (ref 70–99)
Potassium: 3.8 mmol/L (ref 3.5–5.1)
Sodium: 141 mmol/L (ref 135–145)

## 2019-02-12 MED ORDER — LEVETIRACETAM 250 MG PO TABS: 250.0000 mg | ORAL_TABLET | Freq: Two times a day (BID) | ORAL | 0 refills | Status: DC

## 2019-02-12 MED ORDER — LEVETIRACETAM IN NACL 1000 MG/100ML IV SOLN
1000.0000 mg | Freq: Once | INTRAVENOUS | Status: AC
  Administered 2019-02-12: 1000 mg via INTRAVENOUS
  Filled 2019-02-12: qty 100

## 2019-02-12 MED ORDER — LEVETIRACETAM 500 MG PO TABS: 500.0000 mg | ORAL_TABLET | Freq: Once | ORAL | Status: DC

## 2019-02-12 NOTE — ED Provider Notes (Addendum)
[facility] Emergency Department Provider Note    First MD Initiated Contact with Patient 02/12/19 [phone]     (approximate) I have reviewed the triage vital signs and the nursing notes.   HISTORY  Chief Complaint No chief complaint on file.    HPI Isaac Vaughn is a 28 y.o. male the below listed past medical history presents after witnessed brief seizure-like activity.  Lasted only 1 or 2 minutes according to mother that was present.  This was followed by postictal period.  He was actually seen by neurology yesterday with recommendation for increasing his zonisamide antiepileptic medication.  He is also recently started 500 mg of Keppra twice daily.  Arrives slightly postictal but denies any pain or headache.    Past Medical History:  Diagnosis Date  . Asthma   . Epilepsy (HCC)   . Seizures (HCC)    Family History  Problem Relation Age of Onset  . Hypertension Mother   . Asthma Mother    No past surgical history on file. Patient Active Problem List   Diagnosis Date Noted  . Altered mental status 06/07/2016      Prior to Admission medications   Medication Sig Start Date End Date Taking? Authorizing Provider  albuterol (PROVENTIL HFA;VENTOLIN HFA) 108 (90 Base) MCG/ACT inhaler Inhale 2 puffs into the lungs every 6 (six) hours as needed for wheezing or shortness of breath.    [provider]  albuterol (PROVENTIL) (2.5 MG/3ML) 0.083% nebulizer solution Inhale 3 mLs into the lungs every 4 (four) hours as needed for wheezing. 11/04/18 11/04/19  [provider]  methylPREDNISolone (MEDROL DOSEPAK) 4 MG TBPK tablet Take Tapered dose as directed Patient not taking: Reported on 02/08/2019 12/11/17   Joni Reining, PA-C  montelukast (SINGULAIR) 10 MG tablet Take 1 tablet (10 mg total) by mouth daily. 12/11/17   Joni Reining, PA-C  zonisamide (ZONEGRAN) 100 MG capsule Take 300 mg by mouth at bedtime. Take along with 50 mg tablet for total of  350 mg daily 06/21/15   [provider]  zonisamide (ZONEGRAN) 50 MG capsule Take 1 capsule (50 mg total) by mouth at bedtime. Patient taking differently: Take 50 mg by mouth at bedtime. Take along with 300 mg for total of 350 mg 06/08/16   Auburn Bilberry, MD    Allergies Other    Social History Social History   Tobacco Use  . Smoking status: Never Smoker  Substance Use Topics  . Alcohol use: No    Alcohol/week: 0.0 standard drinks  . Drug use: No    Review of Systems Patient denies headaches, rhinorrhea, blurry vision, numbness, shortness of breath, chest pain, edema, cough, abdominal pain, nausea, vomiting, diarrhea, dysuria, fevers, rashes or hallucinations unless otherwise stated above in HPI. ____________________________________________   PHYSICAL EXAM:  VITAL SIGNS: Vitals:   02/12/19 0600 02/12/19 0630  BP: 121/70 126/80  Pulse: 67 (!) 52  Resp: 16 16  SpO2: 97% 100%    Constitutional: Alert and oriented.  Eyes: Conjunctivae are normal.  Head: Atraumatic. Nose: No congestion/rhinnorhea. Mouth/Throat: Mucous membranes are moist.   Neck: No stridor. Painless ROM.  Cardiovascular: Normal rate, regular rhythm. Grossly normal heart sounds.  Good peripheral circulation. Respiratory: Normal respiratory effort.  No retractions. Lungs CTAB. Gastrointestinal: Soft and nontender. No distention. No abdominal bruits. No CVA tenderness. Genitourinary:  Musculoskeletal: No lower extremity tenderness nor edema.  No joint effusions. Neurologic:  Normal speech and language. No gross focal neurologic deficits are  appreciated. No facial droop Skin:  Skin is warm, dry and intact. No rash noted. Psychiatric: Mood and affect are normal. Speech and behavior are normal.  ____________________________________________   LABS (all labs ordered are listed, but only abnormal results are displayed)  No results found for this or any previous visit (from the past 24 hour(s)).  ____________________________________________ ____________________________________________   PROCEDURES  Procedure(s) performed:  Procedures    Critical Care performed: no ____________________________________________   INITIAL IMPRESSION / ASSESSMENT AND PLAN / ED COURSE  Pertinent labs & imaging results that were available during my care of the patient were reviewed by me and considered in my medical decision making (see chart for details).   DDX: seizure, status epilepticus, noncompliance  Isaac Vaughn is a 28 y.o. who presents to the ED with history of seizure disorder presents the ER for another seizure episode.  In brief postictal period but is protecting his airway.  Clinical Course as of Feb 13 20  Thu Feb 12, 2019  0625 Observed in the ER for over 4 hours with no recurrent seizure episodes.  Is receiving additional dose of IV Keppra.  I do not see any indication for repeat neuroimaging as his neuro exam is otherwise back to baseline.  He is otherwise asymptomatic tolerating oral hydration.  Do believe he stable for discharge home for follow-up with neurology.  We will have him increase Keppra to 750 twice daily and follow-up with neuro.   [PR]  [phone]  Spoke with Dr. Johnnye Lana of [facility] neurology who agrees with plan to increase keppra to 750mg  bid.  Have discussed with the patient and available family all diagnostics and treatments performed thus far and all questions were answered to the best of my ability. The patient demonstrates understanding and agreement with plan.    [PR]    Clinical Course User Index [PR] Willy Eddy, MD     As part of my medical decision making, I reviewed the following data within the electronic MEDICAL RECORD NUMBER Nursing notes reviewed and incorporated, Labs reviewed, notes from prior ED visits and  Controlled Substance Database   ____________________________________________   FINAL CLINICAL IMPRESSION(S) / ED DIAGNOSES  Final  diagnoses:  Seizure-like activity (HCC)      NEW MEDICATIONS STARTED DURING THIS VISIT:  New Prescriptions   No medications on file     Note:  This document was prepared using Dragon voice recognition software and may include unintentional dictation errors.    Willy Eddy, MD 02/12/19 1583    Willy Eddy, MD 02/13/19 [phone]

## 2019-02-12 NOTE — ED Notes (Addendum)
Awaiting for pharmacy to send the ordered medication not available in the ED.

## 2019-02-12 NOTE — Discharge Instructions (Addendum)
Please increase your keppra dose to 750mg  twice daily. Follow up with neurology.

## 2019-02-12 NOTE — ED Notes (Signed)
Signature pad not available due to system downtime

## 2019-02-12 NOTE — ED Notes (Signed)
For previous charting please see paper chart.

## 2019-04-21 ENCOUNTER — Other Ambulatory Visit: Payer: Self-pay

## 2019-04-21 ENCOUNTER — Emergency Department
Admission: EM | Admit: 2019-04-21 | Discharge: 2019-04-22 | Disposition: A | Discharge status: Home / Self Care | Payer: Medicaid Other | Attending: Emergency Medicine | Admitting: Emergency Medicine

## 2019-04-21 DIAGNOSIS — R569 Unspecified convulsions: Secondary | ICD-10-CM | POA: Insufficient documentation

## 2019-04-21 DIAGNOSIS — J45909 Unspecified asthma, uncomplicated: Secondary | ICD-10-CM | POA: Diagnosis not present

## 2019-04-21 DIAGNOSIS — Z79899 Other long term (current) drug therapy: Secondary | ICD-10-CM | POA: Insufficient documentation

## 2019-04-21 LAB — URINE DRUG SCREEN, QUALITATIVE (ARMC ONLY)
Amphetamines, Ur Screen: NOT DETECTED
Barbiturates, Ur Screen: NOT DETECTED
Benzodiazepine, Ur Scrn: NOT DETECTED
Cannabinoid 50 Ng, Ur ~~LOC~~: NOT DETECTED
Cocaine Metabolite,Ur ~~LOC~~: NOT DETECTED
MDMA (Ecstasy)Ur Screen: NOT DETECTED
Methadone Scn, Ur: NOT DETECTED
Opiate, Ur Screen: NOT DETECTED
Phencyclidine (PCP) Ur S: NOT DETECTED
Tricyclic, Ur Screen: NOT DETECTED

## 2019-04-21 LAB — URINALYSIS, COMPLETE (UACMP) WITH MICROSCOPIC
Bacteria, UA: NONE SEEN
Bilirubin Urine: NEGATIVE
Glucose, UA: NEGATIVE mg/dL
Hgb urine dipstick: NEGATIVE
Ketones, ur: NEGATIVE mg/dL
Leukocytes,Ua: NEGATIVE
Nitrite: NEGATIVE
Protein, ur: NEGATIVE mg/dL
Specific Gravity, Urine: 1.005 (ref 1.005–1.030)
Squamous Epithelial / HPF: NONE SEEN (ref 0–5)
WBC, UA: NONE SEEN WBC/hpf (ref 0–5)
pH: 8 (ref 5.0–8.0)

## 2019-04-21 LAB — CBC WITH DIFFERENTIAL/PLATELET
Abs Immature Granulocytes: 0.01 10*3/uL (ref 0.00–0.07)
Basophils Absolute: 0 10*3/uL (ref 0.0–0.1)
Basophils Relative: 1 %
Eosinophils Absolute: 0.1 10*3/uL (ref 0.0–0.5)
Eosinophils Relative: 1 %
HCT: 46.7 % (ref 39.0–52.0)
Hemoglobin: 15.9 g/dL (ref 13.0–17.0)
Immature Granulocytes: 0 %
Lymphocytes Relative: 40 %
Lymphs Abs: 2.2 10*3/uL (ref 0.7–4.0)
MCH: 32.4 pg (ref 26.0–34.0)
MCHC: 34 g/dL (ref 30.0–36.0)
MCV: 95.3 fL (ref 80.0–100.0)
Monocytes Absolute: 0.3 10*3/uL (ref 0.1–1.0)
Monocytes Relative: 6 %
Neutro Abs: 2.9 10*3/uL (ref 1.7–7.7)
Neutrophils Relative %: 52 %
Platelets: 238 10*3/uL (ref 150–400)
RBC: 4.9 MIL/uL (ref 4.22–5.81)
RDW: 12.5 % (ref 11.5–15.5)
WBC: 5.6 10*3/uL (ref 4.0–10.5)
nRBC: 0 % (ref 0.0–0.2)

## 2019-04-21 MED ORDER — LORAZEPAM 2 MG/ML IJ SOLN
1.0000 mg | Freq: Once | INTRAMUSCULAR | Status: AC
  Administered 2019-04-21: 22:00:00 2 mg via INTRAVENOUS

## 2019-04-21 MED ORDER — LORAZEPAM 2 MG/ML IJ SOLN: 1.0000 mg | INTRAMUSCULAR | Status: DC

## 2019-04-21 MED ORDER — LORAZEPAM 2 MG/ML IJ SOLN
INTRAMUSCULAR | Status: AC
  Administered 2019-04-21: 2 mg via INTRAVENOUS
  Filled 2019-04-21: qty 1

## 2019-04-21 MED ORDER — LEVETIRACETAM IN NACL 1000 MG/100ML IV SOLN
1000.0000 mg | Freq: Once | INTRAVENOUS | Status: AC
  Administered 2019-04-21: 1000 mg via INTRAVENOUS
  Filled 2019-04-21: qty 100

## 2019-04-21 NOTE — ED Notes (Signed)
Seizure pads placed on bed for safety.

## 2019-04-21 NOTE — ED Triage Notes (Signed)
Patient arrived via [facility]. With reported Seizures. Seizure lasted about 5 minutes.  Vitals stable within route. Patient  has a  history of seizures.

## 2019-04-21 NOTE — ED Provider Notes (Signed)
[facility] Emergency Department Provider Note   ____________________________________________   First MD Initiated Contact with Patient 04/21/19 2149     (approximate)  I have reviewed the triage vital signs and the nursing notes.   HISTORY  Chief Complaint Seizures  History limited by postictal state  HPI Isaac Vaughn is a 28 y.o. male patient with a history of seizures.  He has usually 1 a month or so.  Last seizure was about a month ago.  At that time he had several seizures back to back.  [facility] and his medicines were adjusted.  He has not had a seizure since.  Seizure today lasted possibly longer than usual.  About 5 minutes.  He is on arrival in the emergency room awake alert but not responding and not speaking.  Will look at you.  He apparently per his aunt has prolonged postictal phases where he does not speak.  Several minutes after arriving in the emergency room he begins having twitching of his eyelids and the right side of his neck.  This looks like he is going to develop another seizure.  Give him 1 mg of Ativan wait several minutes and then give him another 1 mg of Ativan as the twitching did not subside.  I will order some Keppra as well.  Patient's aunt drove him back from work.  She said he took his medicines today when he got back from work.         Past Medical History:  Diagnosis Date  . Asthma   . Epilepsy (HCC)   . Seizures [facility](HCC)     Patient Active Problem List   Diagnosis Date Noted  . Altered mental status 06/07/2016    No past surgical history on file.  Prior to Admission medications   Medication Sig Start Date End Date Taking? Authorizing Provider  albuterol (PROVENTIL HFA;VENTOLIN HFA) 108 (90 Base) MCG/ACT inhaler Inhale 2 puffs into the lungs every 6 (six) hours as needed for wheezing or shortness of breath.    [provider]  albuterol (PROVENTIL) (2.5 MG/3ML) 0.083% nebulizer  solution Inhale 3 mLs into the lungs every 4 (four) hours as needed for wheezing. 11/04/18 11/04/19  [provider]  levETIRAcetam (KEPPRA) 250 MG tablet Take 1 tablet (250 mg total) by mouth 2 (two) times daily. 02/12/19   Willy Eddyobinson, Patrick, MD  methylPREDNISolone (MEDROL DOSEPAK) 4 MG TBPK tablet Take Tapered dose as directed Patient not taking: Reported on 02/08/2019 12/11/17   Joni ReiningSmith, Ronald K, PA-C  montelukast (SINGULAIR) 10 MG tablet Take 1 tablet (10 mg total) by mouth daily. 12/11/17   Joni ReiningSmith, Ronald K, PA-C  zonisamide (ZONEGRAN) 100 MG capsule Take 300 mg by mouth at bedtime. Take along with 50 mg tablet for total of 350 mg daily 06/21/15   [provider]  zonisamide (ZONEGRAN) 50 MG capsule Take 1 capsule (50 mg total) by mouth at bedtime. Patient taking differently: Take 50 mg by mouth at bedtime. Take along with 300 mg for total of 350 mg 06/08/16   Auburn BilberryPatel, Shreyang, MD    Allergies Other  Family History  Problem Relation Age of Onset  . Hypertension Mother   . Asthma Mother     Social History Social History   Tobacco Use  . Smoking status: Never Smoker  Substance Use Topics  . Alcohol use: No    Alcohol/week: 0.0 standard drinks  . Drug use: No  Review of Systems Unable to obtain ____________________________________________   PHYSICAL EXAM:  VITAL SIGNS: ED Triage Vitals  Enc Vitals Group     BP 04/21/19 2147 137/85     Pulse Rate 04/21/19 2147 66     Resp 04/21/19 2147 15     Temp 04/21/19 2147 98.6 F (37 C)     Temp Source 04/21/19 2147 Rectal     SpO2 04/21/19 2148 100 %     Weight 04/21/19 2144 245 lb (111.1 kg)     Height 04/21/19 2144 5\' 11"  (1.803 m)     Head Circumference --      Peak Flow --      Pain Score --      Pain Loc --      Pain Edu? --      Excl. in GC? --     Constitutional: Initially awake as described in HPI no apparent distress but not speaking.  Will not follow mans but will look at you. Eyes: Conjunctivae are  normal. PER. EOMI. Head: Atraumatic. Nose: No congestion/rhinnorhea. Mouth/Throat: Mucous membranes are moist.  Oropharynx non-erythematous. Neck: No stridor.  Cardiovascular: Normal rate, regular rhythm. Grossly normal heart sounds.  Good peripheral circulation. Respiratory: Normal respiratory effort.  No retractions. Lungs CTAB. Gastrointestinal: Soft and nontender. No distention. No abdominal bruits. No CVA tenderness. Musculoskeletal: No lower extremity tenderness nor edema.   Neurologic: Patient not moving much.  DTRs are barely perceptible in both biceps but 1+ in the knees and ankles.  Toes are neither up or down.. Skin:  Skin is warm, dry and intact. No rash noted.   ____________________________________________   LABS (all labs ordered are listed, but only abnormal results are displayed)  Labs Reviewed  URINALYSIS, COMPLETE (UACMP) WITH MICROSCOPIC - Abnormal; Notable for the following components:      Result Value   Color, Urine STRAW (*)    APPearance CLEAR (*)    All other components within normal limits  CBC WITH DIFFERENTIAL/PLATELET  COMPREHENSIVE METABOLIC PANEL  URINE DRUG SCREEN, QUALITATIVE ([facility] ONLY)   ____________________________________________  EKG  EKG read interpreted by me shows normal sinus rhythm rate of 65 normal axis no acute ST-T changes ____________________________________________  RADIOLOGY  ED MD interpretation:  Official radiology report(s): No results found.  ____________________________________________   PROCEDURES  Procedure(s) performed (including Critical Care):  Procedures   ____________________________________________   INITIAL IMPRESSION / ASSESSMENT AND PLAN / ED COURSE  Is now stable waking up slowly we will watch him until he is back to normal.  If he is not back to normal then Dr. Melvenia Beam will get him admitted overnight.             ____________________________________________   FINAL CLINICAL  IMPRESSION(S) / ED DIAGNOSES  Final diagnoses:  Seizure [facility])     ED Discharge Orders    None       Note:  This document was prepared using Dragon voice recognition software and may include unintentional dictation errors.    Arnaldo Natal, MD 04/21/19 2350

## 2019-04-22 ENCOUNTER — Emergency Department: Payer: Medicaid Other

## 2019-04-22 LAB — COMPREHENSIVE METABOLIC PANEL
ALT: 25 U/L (ref 0–44)
AST: 21 U/L (ref 15–41)
Albumin: 4.4 g/dL (ref 3.5–5.0)
Alkaline Phosphatase: 50 U/L (ref 38–126)
Anion gap: 9 (ref 5–15)
BUN: 13 mg/dL (ref 6–20)
CO2: 20 mmol/L — ABNORMAL LOW (ref 22–32)
Calcium: 8.8 mg/dL — ABNORMAL LOW (ref 8.9–10.3)
Chloride: 110 mmol/L (ref 98–111)
Creatinine, Ser: 1.2 mg/dL (ref 0.61–1.24)
GFR calc Af Amer: >60 mL/min (ref >60)
GFR calc non Af Amer: >60 mL/min (ref >60)
Glucose, Bld: 91 mg/dL (ref 70–99)
Potassium: 3.6 mmol/L (ref 3.5–5.1)
Sodium: 139 mmol/L (ref 135–145)
Total Bilirubin: 0.5 mg/dL (ref 0.3–1.2)
Total Protein: 7.4 g/dL (ref 6.5–8.1)

## 2019-04-22 NOTE — ED Notes (Signed)
MD Willimas at bedside to update pt.

## 2019-04-22 NOTE — ED Notes (Signed)
This RN spoke with Pt's mother to provide status update.

## 2019-04-22 NOTE — ED Notes (Signed)
Pt sleeping soundly. No signs of distress. VSS.

## 2019-04-22 NOTE — ED Notes (Signed)
This RN spoke with mother regarding pt status. Pt is note responding questions at this time. Pt st "feeling weak". VSS. NAD noted at this time. MD  WIlliams  notified.

## 2019-04-22 NOTE — ED Notes (Signed)
Pt provided with sandwich tray and 8oz of water. Pt ate 100% of his meal tray and water. Pt VSS.

## 2019-04-22 NOTE — ED Notes (Signed)
Assisted patient with toileting . Skin care given. Linens changed.

## 2019-04-22 NOTE — ED Notes (Signed)
This RN has contact mother to pick up pt. Pt's mother is his ride home. Mother st she will arrive within 10 to 15 min.

## 2019-04-22 NOTE — ED Notes (Signed)
Pt able to stand on his own and sat on wheelchair. NAD noted at this time. Pt A/Ox4

## 2019-04-22 NOTE — ED Provider Notes (Signed)
-----------------------------------------   12:20 AM on 04/22/2019 -----------------------------------------  Assumed care of patient.  Reviewed results of lab work.  Patient is currently sleeping in no acute distress.  Will continue to monitor in the ED.   ----------------------------------------- 6:15 AM on 04/22/2019 -----------------------------------------  Patient arousable to voice.  Voices no complaints.  Slept overnight and is starting to wake up.  Anticipate discharge home once patient is awake and back to baseline.  Nursing left message for mother.  Would like for mother to come to verify patient is back to his baseline prior to discharge.  Care transferred to Dr. Mayford Knife at change of shift.   Irean Hong, MD 04/22/19 [phone]

## 2019-04-22 NOTE — ED Notes (Signed)
Pt sleeping soundly. No signs of distress.

## 2019-04-23 ENCOUNTER — Emergency Department: Payer: Medicaid Other

## 2019-04-23 ENCOUNTER — Other Ambulatory Visit: Payer: Self-pay

## 2019-04-23 ENCOUNTER — Emergency Department
Admission: EM | Admit: 2019-04-23 | Discharge: 2019-04-23 | Disposition: A | Discharge status: Home / Self Care | Payer: Medicaid Other | Attending: Student in an Organized Health Care Education/Training Program | Admitting: Student in an Organized Health Care Education/Training Program

## 2019-04-23 DIAGNOSIS — J45909 Unspecified asthma, uncomplicated: Secondary | ICD-10-CM | POA: Insufficient documentation

## 2019-04-23 DIAGNOSIS — Z1159 Encounter for screening for other viral diseases: Secondary | ICD-10-CM | POA: Insufficient documentation

## 2019-04-23 DIAGNOSIS — Z79899 Other long term (current) drug therapy: Secondary | ICD-10-CM | POA: Insufficient documentation

## 2019-04-23 DIAGNOSIS — R569 Unspecified convulsions: Secondary | ICD-10-CM | POA: Insufficient documentation

## 2019-04-23 LAB — COMPREHENSIVE METABOLIC PANEL
ALT: 23 U/L (ref 0–44)
AST: 23 U/L (ref 15–41)
Albumin: 4.4 g/dL (ref 3.5–5.0)
Alkaline Phosphatase: 48 U/L (ref 38–126)
Anion gap: 6 (ref 5–15)
BUN: 14 mg/dL (ref 6–20)
CO2: 23 mmol/L (ref 22–32)
Calcium: 9.1 mg/dL (ref 8.9–10.3)
Chloride: 110 mmol/L (ref 98–111)
Creatinine, Ser: 1.02 mg/dL (ref 0.61–1.24)
GFR calc Af Amer: >60 mL/min (ref >60)
GFR calc non Af Amer: >60 mL/min (ref >60)
Glucose, Bld: 82 mg/dL (ref 70–99)
Potassium: 4.4 mmol/L (ref 3.5–5.1)
Sodium: 139 mmol/L (ref 135–145)
Total Bilirubin: 0.7 mg/dL (ref 0.3–1.2)
Total Protein: 7.6 g/dL (ref 6.5–8.1)

## 2019-04-23 LAB — CBC WITH DIFFERENTIAL/PLATELET
Abs Immature Granulocytes: 0.02 10*3/uL (ref 0.00–0.07)
Basophils Absolute: 0 10*3/uL (ref 0.0–0.1)
Basophils Relative: 1 %
Eosinophils Absolute: 0.1 10*3/uL (ref 0.0–0.5)
Eosinophils Relative: 1 %
HCT: 49.9 % (ref 39.0–52.0)
Hemoglobin: 16.9 g/dL (ref 13.0–17.0)
Immature Granulocytes: 0 %
Lymphocytes Relative: 39 %
Lymphs Abs: 1.9 10*3/uL (ref 0.7–4.0)
MCH: 32.7 pg (ref 26.0–34.0)
MCHC: 33.9 g/dL (ref 30.0–36.0)
MCV: 96.5 fL (ref 80.0–100.0)
Monocytes Absolute: 0.4 10*3/uL (ref 0.1–1.0)
Monocytes Relative: 9 %
Neutro Abs: 2.4 10*3/uL (ref 1.7–7.7)
Neutrophils Relative %: 50 %
Platelets: 222 10*3/uL (ref 150–400)
RBC: 5.17 MIL/uL (ref 4.22–5.81)
RDW: 12.4 % (ref 11.5–15.5)
WBC: 4.7 10*3/uL (ref 4.0–10.5)
nRBC: 0 % (ref 0.0–0.2)

## 2019-04-23 LAB — URINE DRUG SCREEN, QUALITATIVE (ARMC ONLY)
Amphetamines, Ur Screen: NOT DETECTED
Barbiturates, Ur Screen: NOT DETECTED
Benzodiazepine, Ur Scrn: NOT DETECTED
Cannabinoid 50 Ng, Ur ~~LOC~~: NOT DETECTED
Cocaine Metabolite,Ur ~~LOC~~: NOT DETECTED
MDMA (Ecstasy)Ur Screen: NOT DETECTED
Methadone Scn, Ur: NOT DETECTED
Opiate, Ur Screen: NOT DETECTED
Phencyclidine (PCP) Ur S: NOT DETECTED
Tricyclic, Ur Screen: NOT DETECTED

## 2019-04-23 LAB — TROPONIN I: Troponin I: <0.03 ng/mL (ref <0.03)

## 2019-04-23 LAB — SARS CORONAVIRUS 2 BY RT PCR (HOSPITAL ORDER, PERFORMED IN ~~LOC~~ HOSPITAL LAB): SARS Coronavirus 2: NEGATIVE

## 2019-04-23 MED ORDER — ONDANSETRON HCL 4 MG/2ML IJ SOLN
4.0000 mg | Freq: Once | INTRAMUSCULAR | Status: AC
  Administered 2019-04-23: 17:00:00 4 mg via INTRAVENOUS
  Filled 2019-04-23: qty 2

## 2019-04-23 MED ORDER — LEVETIRACETAM IN NACL 500 MG/100ML IV SOLN
500.0000 mg | Freq: Once | INTRAVENOUS | Status: AC
  Administered 2019-04-23: 500 mg via INTRAVENOUS
  Filled 2019-04-23: qty 100

## 2019-04-23 NOTE — ED Triage Notes (Signed)
PT to ED via EMS. PT lives at home. Was riding in the car with his mom when he began to have a seizure that lasted less than 5 min per mom. Pt is currently post ictal. PT hx of seizures, was seen last week for same. EMS states doctors have just been raising his mediations.

## 2019-04-23 NOTE — Discharge Instructions (Signed)
Patient seizure medication can be taken normally. Follow-up with patient's neurologist regarding changes in seizure frequency.

## 2019-04-23 NOTE — ED Notes (Signed)
Assisted pt to use urinal  

## 2019-04-23 NOTE — ED Provider Notes (Signed)
[facility] Emergency Department Provider Note  ____________________________________________  Time seen: Approximately 3:12 PM  I have reviewed the triage vital signs and the nursing notes.   HISTORY  Chief Complaint Seizures    HPI Isaac Vaughn is a 28 y.o. male with a history of epilepsy and asthma, presents to the emergency department after a seizure that occurred in route to the emergency department today.  Patient reports that mother and patient were coming to the emergency department due to acute onset of shortness of breath, chest tightness and chest pain that started this morning.  On the way to the emergency department, patient asked for his mother to pull over and call 911 because he felt like he could not "catch my breath".  While waiting for paramedics, patient's mother states that patient developed a glassy look with tear formation which is consistent with prior episodes of seizures in the past.  Episode lasted for approximately 30 to 45 seconds.  Patient had no repetitive movements during seizure.  Patient is currently taking Keppra 500 mg twice daily and zonisamide for his seizures. Patient is seen by [facility].  Patient has had no recent fever, cough or shortness of breath.  Patient's mother denies any history of cardiac issues and states that he has never experienced shortness of breath or chest pain in the past.        Past Medical History:  Diagnosis Date  . Asthma   . Epilepsy (HCC)   . Seizures [facility](HCC)     Patient Active Problem List   Diagnosis Date Noted  . Altered mental status 06/07/2016    No past surgical history on file.  Prior to Admission medications   Medication Sig Start Date End Date Taking? Authorizing Provider  levETIRAcetam (KEPPRA) 250 MG tablet Take 1 tablet (250 mg total) by mouth 2 (two) times daily. Patient taking differently: Take 500 mg by mouth 2 (two) times daily.  02/12/19  Yes Willy Eddyobinson,  Patrick, MD  Midazolam (NAYZILAM) 5 MG/0.1ML SOLN Place 5 mg into the nose as directed. May repeat in other nostril after 10 minutes if needed   Yes [provider]  montelukast (SINGULAIR) 10 MG tablet Take 1 tablet (10 mg total) by mouth daily. 12/11/17  Yes Joni ReiningSmith, Ronald K, PA-C  zonisamide (ZONEGRAN) 100 MG capsule Take 500 mg by mouth at bedtime.  06/21/15  Yes [provider]  zonisamide (ZONEGRAN) 50 MG capsule Take 1 capsule (50 mg total) by mouth at bedtime. Patient not taking: Reported on 04/23/2019 06/08/16   Auburn BilberryPatel, Shreyang, MD    Allergies Other  Family History  Problem Relation Age of Onset  . Hypertension Mother   . Asthma Mother     Social History Social History   Tobacco Use  . Smoking status: Never Smoker  Substance Use Topics  . Alcohol use: No    Alcohol/week: 0.0 standard drinks  . Drug use: No     Review of Systems  Constitutional: No fever/chills Eyes: No visual changes. No discharge ENT: No upper respiratory complaints. Cardiovascular: no chest pain. Respiratory: Patient has SOB and chest tightness. Gastrointestinal: No abdominal pain.  No nausea, no vomiting.  No diarrhea.  No constipation. Genitourinary: Negative for dysuria. No hematuria Musculoskeletal: Negative for musculoskeletal pain. Skin: Negative for rash, abrasions, lacerations, ecchymosis. Neurological: Patient had seizure.    ____________________________________________   PHYSICAL EXAM:  VITAL SIGNS: ED Triage Vitals  Enc Vitals Group     BP  04/23/19 1343 139/80     Pulse Rate 04/23/19 1343 64     Resp 04/23/19 1343 (!) 26     Temp 04/23/19 1343 97.9 F (36.6 C)     Temp Source 04/23/19 1343 Oral     SpO2 04/23/19 1340 100 %     Weight 04/23/19 1345 245 lb (111.1 kg)     Height 04/23/19 1345 5\' 11"  (1.803 m)     Head Circumference --      Peak Flow --      Pain Score --      Pain Loc --      Pain Edu? --      Excl. in GC? --      Constitutional: Alert  and oriented.  Patient seems subdued and in a postictal state. Eyes: Conjunctivae are normal. PERRL. EOMI. Head: Atraumatic. ENT:      Nose: No congestion/rhinnorhea.      Mouth/Throat: Mucous membranes are moist.  Neck: No stridor.  No cervical spine tenderness to palpation. Cardiovascular: Normal rate, regular rhythm. Normal S1 and S2.  Good peripheral circulation. Respiratory: Normal respiratory effort without tachypnea or retractions. Lungs CTAB. Good air entry to the bases with no decreased or absent breath sounds. Gastrointestinal: Bowel sounds 4 quadrants. Soft and nontender to palpation. No guarding or rigidity. No palpable masses. No distention. No CVA tenderness. Musculoskeletal: Full range of motion to all extremities. No gross deformities appreciated. Neurologic:  Normal speech and language. No gross focal neurologic deficits are appreciated.  Skin:  Skin is warm, dry and intact. No rash noted. Psychiatric: Mood and affect are normal. Speech and behavior are normal. Patient exhibits appropriate insight and judgement.   ____________________________________________   LABS (all labs ordered are listed, but only abnormal results are displayed)  Labs Reviewed  SARS CORONAVIRUS 2 (HOSPITAL ORDER, PERFORMED IN [facility])  CBC WITH DIFFERENTIAL/PLATELET  COMPREHENSIVE METABOLIC PANEL  TROPONIN I  URINE DRUG SCREEN, QUALITATIVE ([facility] ONLY)   ____________________________________________  EKG  Revealed normal sinus rhythm without ST segment elevation.  No T wave abnormalities. ____________________________________________  RADIOLOGY I personally viewed and evaluated these images as part of my medical decision making, as well as reviewing the written report by the radiologist.    Dg Chest 1 View  Result Date: 04/23/2019 CLINICAL DATA:  Chest pain and shortness of breath EXAM: CHEST  1 VIEW COMPARISON:  12/11/17 FINDINGS: The heart size and mediastinal contours  are within normal limits. Both lungs are clear. The visualized skeletal structures are unremarkable. IMPRESSION: No active disease. Electronically Signed   By: Alcide Clever M.D.   On: 04/23/2019 15:31    ____________________________________________    PROCEDURES  Procedure(s) performed:    Procedures    Medications  levETIRAcetam (KEPPRA) IVPB 500 mg/100 mL premix (0 mg Intravenous Stopped 04/23/19 1640)  ondansetron (ZOFRAN) injection 4 mg (4 mg Intravenous Given 04/23/19 1649)     ____________________________________________   INITIAL IMPRESSION / ASSESSMENT AND PLAN / ED COURSE  Pertinent labs & imaging results that were available during my care of the patient were reviewed by me and considered in my medical decision making (see chart for details).  Review of the [place] CSRS was performed in accordance of the [facility] prior to dispensing any controlled drugs.         Assessment and Plan:  Seizure-like activity 28 year old male presents to the emergency department after one thirty second episode of seizure-like activity while on the way to the emergency department  for chest tightness and shortness of breath.  On physical exam, patient appeared to be postictal.   Differential diagnosis included seizure-like activity, COVID-19, PE, community-acquired pneumonia, STEMI.  Patient tested negative for COVID-19 in the emergency department.  Patient was PERC negative with low suspicion for PE.  Chest x-ray revealed no acute abnormality.  CBC and CMP were reassuring.  Troponin was within reference range.  EKG revealed normal sinus rhythm without ST segment elevation.  Neurologist on call with [facility] neurology Dr. Marry Guan  recommended keeping patient on his prescribed seizure medications and following up with him in the office as an outpatient.    ----------------------------------------- 5:28 PM on 04/23/2019 -----------------------------------------  Spoke with Dr.Panebianco with  [facility].  He advised against making medication adjustment at this time. Stated he was going to correspond with patient's primary neurologist and have patient follow-up with him in the office. All patient questions were answered.     ____________________________________________  FINAL CLINICAL IMPRESSION(S) / ED DIAGNOSES  Final diagnoses:  Seizure-like activity (HCC)      NEW MEDICATIONS STARTED DURING THIS VISIT:  ED Discharge Orders    None          This chart was dictated using voice recognition software/Dragon. Despite best efforts to proofread, errors can occur which can change the meaning. Any change was purely unintentional.    Orvil Feil, PA-C 04/23/19 1910    Willy Eddy, MD 04/23/19 [phone]

## 2019-04-29 ENCOUNTER — Emergency Department
Admission: EM | Admit: 2019-04-29 | Discharge: 2019-04-29 | Disposition: A | Discharge status: Home / Self Care | Payer: Medicaid Other | Attending: Emergency Medicine | Admitting: Emergency Medicine

## 2019-04-29 ENCOUNTER — Other Ambulatory Visit: Payer: Self-pay

## 2019-04-29 DIAGNOSIS — R569 Unspecified convulsions: Secondary | ICD-10-CM | POA: Insufficient documentation

## 2019-04-29 DIAGNOSIS — J45909 Unspecified asthma, uncomplicated: Secondary | ICD-10-CM | POA: Diagnosis not present

## 2019-04-29 DIAGNOSIS — Z79899 Other long term (current) drug therapy: Secondary | ICD-10-CM | POA: Diagnosis not present

## 2019-04-29 LAB — COMPREHENSIVE METABOLIC PANEL
ALT: 25 U/L (ref 0–44)
AST: 18 U/L (ref 15–41)
Albumin: 4.5 g/dL (ref 3.5–5.0)
Alkaline Phosphatase: 51 U/L (ref 38–126)
Anion gap: 7 (ref 5–15)
BUN: 11 mg/dL (ref 6–20)
CO2: 24 mmol/L (ref 22–32)
Calcium: 8.9 mg/dL (ref 8.9–10.3)
Chloride: 108 mmol/L (ref 98–111)
Creatinine, Ser: 1.2 mg/dL (ref 0.61–1.24)
GFR calc Af Amer: >60 mL/min (ref >60)
GFR calc non Af Amer: >60 mL/min (ref >60)
Glucose, Bld: 98 mg/dL (ref 70–99)
Potassium: 3.7 mmol/L (ref 3.5–5.1)
Sodium: 139 mmol/L (ref 135–145)
Total Bilirubin: 0.2 mg/dL — ABNORMAL LOW (ref 0.3–1.2)
Total Protein: 7.5 g/dL (ref 6.5–8.1)

## 2019-04-29 LAB — CBC
HCT: 47.8 % (ref 39.0–52.0)
Hemoglobin: 16.3 g/dL (ref 13.0–17.0)
MCH: 32.9 pg (ref 26.0–34.0)
MCHC: 34.1 g/dL (ref 30.0–36.0)
MCV: 96.4 fL (ref 80.0–100.0)
Platelets: 226 10*3/uL (ref 150–400)
RBC: 4.96 MIL/uL (ref 4.22–5.81)
RDW: 12.5 % (ref 11.5–15.5)
WBC: 5.1 10*3/uL (ref 4.0–10.5)
nRBC: 0 % (ref 0.0–0.2)

## 2019-04-29 NOTE — ED Notes (Signed)
This RN spoke with mother of pt on phone, Larkin Ina, with pt consent.

## 2019-04-29 NOTE — ED Triage Notes (Signed)
Per EMS the patient's family found patient following a seizure. Patient had a non mechanical fall. Patient states that he does not remember the fall.

## 2019-04-29 NOTE — ED Notes (Signed)
Report given to Paige,RN.

## 2019-04-29 NOTE — ED Provider Notes (Signed)
[facility] Emergency Department Provider Note   ____________________________________________    I have reviewed the triage vital signs and the nursing notes.   HISTORY  Chief Complaint Seizures and Fall     HPI Isaac Vaughn is a 28 y.o. male with a history of epilepsy who presents after a seizure.  Apparently seizure was witnessed by family.  Upon arrival the patient is nonverbal which is typical of his postictal state.  He does follow simple commands.  No evidence of trauma.  Review of medical records demonstrates [facility].  Review of medical records in the chart the patient is on Keppra and zonisamide daily apparently this had been working well.  Past Medical History:  Diagnosis Date  . Asthma   . Epilepsy (HCC)   . Seizures [facility](HCC)     Patient Active Problem List   Diagnosis Date Noted  . Altered mental status 06/07/2016    No past surgical history on file.  Prior to Admission medications   Medication Sig Start Date End Date Taking? Authorizing Provider  dicyclomine (BENTYL) 20 MG tablet Take 20 mg by mouth every 6 (six) hours as needed for cramping. 04/29/19 05/29/19 Yes [provider]  levETIRAcetam (KEPPRA) 250 MG tablet Take 1 tablet (250 mg total) by mouth 2 (two) times daily. Patient taking differently: Take 500 mg by mouth 2 (two) times daily.  02/12/19  Yes Willy Eddyobinson, Patrick, MD  montelukast (SINGULAIR) 10 MG tablet Take 1 tablet (10 mg total) by mouth daily. 12/11/17  Yes Joni ReiningSmith, Ronald K, PA-C  ondansetron (ZOFRAN-ODT) 4 MG disintegrating tablet Take 4 mg by mouth every 8 (eight) hours as needed for nausea. 04/29/19 05/06/19 Yes [provider]  polyethylene glycol (MIRALAX / GLYCOLAX) 17 g packet Take 17 g by mouth 3 (three) times daily. 04/29/19  Yes [provider]  zonisamide (ZONEGRAN) 100 MG capsule Take 500 mg by mouth at bedtime.  06/21/15  Yes [provider]  Midazolam (NAYZILAM) 5 MG/0.1ML SOLN Place 5 mg into the nose as directed. May repeat in other nostril after 10 minutes if needed    [provider]  zonisamide (ZONEGRAN) 50 MG capsule Take 1 capsule (50 mg total) by mouth at bedtime. Patient not taking: Reported on 04/23/2019 06/08/16   Auburn BilberryPatel, Shreyang, MD     Allergies Other  Family History  Problem Relation Age of Onset  . Hypertension Mother   . Asthma Mother     Social History Social History   Tobacco Use  . Smoking status: Never Smoker  Substance Use Topics  . Alcohol use: No    Alcohol/week: 0.0 standard drinks  . Drug use: No    Unable to obtain review of Systems at this time as the patient is postictal     ____________________________________________   PHYSICAL EXAM:  VITAL SIGNS: ED Triage Vitals [04/29/19 1714]  Enc Vitals Group     BP (!) 140/100     Pulse      Resp (!) 34     Temp 98.4 F (36.9 C)     Temp src      SpO2 99 %     Weight      Height      Head Circumference      Peak Flow      Pain Score      Pain Loc      Pain Edu?  Excl. in GC?     Constitutional: Alert follows simple commands Eyes: Conjunctivae are normal.  Head: Atraumatic. Nose: No congestion/rhinnorhea. Mouth/Throat: Mucous membranes are moist.    Cardiovascular: Normal rate, regular rhythm. Grossly normal heart sounds.  Good peripheral circulation. Respiratory: Normal respiratory effort.  No retractions. Lungs CTAB. Gastrointestinal: Soft and nontender. No distention.  No CVA tenderness. Genitourinary: deferred Musculoskeletal: Warm and well perfused Neurologic: Nonverbal currently, no gross focal neurologic deficits are appreciated.  Skin:  Skin is warm, dry and intact. No rash noted.   ____________________________________________   LABS (all labs ordered are listed, but only abnormal results are displayed)  Labs Reviewed  COMPREHENSIVE METABOLIC PANEL - Abnormal; Notable for the  following components:      Result Value   Total Bilirubin 0.2 (*)    All other components within normal limits  CBC   ____________________________________________  EKG  None ____________________________________________  RADIOLOGY   ____________________________________________   PROCEDURES  Procedure(s) performed: No  Procedures   Critical Care performed: No ____________________________________________   INITIAL IMPRESSION / ASSESSMENT AND PLAN / ED COURSE  Pertinent labs & imaging results that were available during my care of the patient were reviewed by me and considered in my medical decision making (see chart for details).  Patient presents after seizure, he has a history of epilepsy.  Unclear whether he has been compliant with his medications but reportedly has been in the past.  We will watch him carefully in the emergency department, IV placed   ----------------------------------------- 10:50 PM on 04/29/2019 -----------------------------------------  Patient has been observed in the emergency department for quite some time with no further seizure activity.  Suspect his seizure today was related to apparently missing a dose of medication.  He is at his baseline currently feels well and is ready for discharge.  Emphasized to him the need for follow-up with his neurologist at [facility]    ____________________________________________   FINAL CLINICAL IMPRESSION(S) / ED DIAGNOSES  Final diagnoses:  Seizure [facility])        Note:  This document was prepared using Dragon voice recognition software and may include unintentional dictation errors.   Jene Every, MD 04/29/19 2251

## 2019-05-01 ENCOUNTER — Emergency Department
Admission: EM | Admit: 2019-05-01 | Discharge: 2019-05-01 | Disposition: A | Discharge status: Home / Self Care | Payer: Medicaid Other | Attending: Emergency Medicine | Admitting: Emergency Medicine

## 2019-05-01 ENCOUNTER — Encounter: Payer: Self-pay | Admitting: Emergency Medicine

## 2019-05-01 ENCOUNTER — Other Ambulatory Visit: Payer: Self-pay

## 2019-05-01 DIAGNOSIS — J45909 Unspecified asthma, uncomplicated: Secondary | ICD-10-CM | POA: Diagnosis not present

## 2019-05-01 DIAGNOSIS — Z79899 Other long term (current) drug therapy: Secondary | ICD-10-CM | POA: Diagnosis not present

## 2019-05-01 DIAGNOSIS — R569 Unspecified convulsions: Secondary | ICD-10-CM | POA: Diagnosis present

## 2019-05-01 MED ORDER — SODIUM CHLORIDE 0.9 % IV SOLN
2000.0000 mg | Freq: Once | INTRAVENOUS | Status: AC
  Administered 2019-05-01: 2000 mg via INTRAVENOUS
  Filled 2019-05-01: qty 20

## 2019-05-01 NOTE — ED Notes (Signed)
Mom at bedside, called out stating patient is having seizure.  Pt found to have eyes fluttering.  MD notified and at bedside.

## 2019-05-01 NOTE — ED Triage Notes (Signed)
Pt to ED from home via EMS c/o seizure today at home.  Per EMS mom states pt's eyes were fluttering and that's when she knows he is having one.  Pt seen recently for same and also being treated for constipation.  Per EMS having abd pain and no bowel movements.  Keppra was doubled since last visit.  Pt alert, following some commands, not answering questions at this time.  Pt appears in NAD.  EMS vitals 84 CBG, 130/70 BP, 98% RA.

## 2019-05-01 NOTE — Discharge Instructions (Addendum)
Increase your Keppra to 1000mg  twice a day. Follow up with your Neurologist on Monday.

## 2019-05-01 NOTE — ED Provider Notes (Signed)
[facility] Emergency Department Provider Note  ____________________________________________  Time seen: Approximately 9:31 AM  I have reviewed the triage vital signs and the nursing notes.   HISTORY  Chief Complaint Seizures  Level 5 caveat:  Portions of the history and physical were unable to be obtained due to post-ictal   HPI Isaac Vaughn is a 28 y.o. male with a history of epilepsy on Keppra and Zonegran, asthma who presents for evaluation of seizure.  Patient was found laying on his bed with his eyelids flickering and not responding to questions by his mother.  This is usually his presentation for his postictal state.  At this time patient is awake, good eye contact but not speaking or following commands.  Patient is unable to provide any history at this time.  Past Medical History:  Diagnosis Date  . Asthma   . Epilepsy (HCC)   . Seizures [facility])     Patient Active Problem List   Diagnosis Date Noted  . Altered mental status 06/07/2016    History reviewed. No pertinent surgical history.  Prior to Admission medications   Medication Sig Start Date End Date Taking? Authorizing Provider  dicyclomine (BENTYL) 20 MG tablet Take 20 mg by mouth every 6 (six) hours as needed for cramping. 04/29/19 05/29/19  [provider]  levETIRAcetam (KEPPRA) 250 MG tablet Take 1 tablet (250 mg total) by mouth 2 (two) times daily. Patient taking differently: Take 500 mg by mouth 2 (two) times daily.  02/12/19   Willy Eddy, MD  Midazolam (NAYZILAM) 5 MG/0.1ML SOLN Place 5 mg into the nose as directed. May repeat in other nostril after 10 minutes if needed    [provider]  montelukast (SINGULAIR) 10 MG tablet Take 1 tablet (10 mg total) by mouth daily. 12/11/17   Joni Reining, PA-C  ondansetron (ZOFRAN-ODT) 4 MG disintegrating tablet Take 4 mg by mouth every 8 (eight) hours as needed for nausea. 04/29/19 05/06/19  [provider]   polyethylene glycol (MIRALAX / GLYCOLAX) 17 g packet Take 17 g by mouth 3 (three) times daily. 04/29/19   [provider]  zonisamide (ZONEGRAN) 100 MG capsule Take 500 mg by mouth at bedtime.  06/21/15   [provider]  zonisamide (ZONEGRAN) 50 MG capsule Take 1 capsule (50 mg total) by mouth at bedtime. Patient not taking: Reported on 04/23/2019 06/08/16   Auburn Bilberry, MD    Allergies Other  Family History  Problem Relation Age of Onset  . Hypertension Mother   . Asthma Mother     Social History Social History   Tobacco Use  . Smoking status: Never Smoker  . Smokeless tobacco: Never Used  Substance Use Topics  . Alcohol use: No    Alcohol/week: 0.0 standard drinks  . Drug use: No    Review of Systems Neurological: + seizure  ____________________________________________   PHYSICAL EXAM:  VITAL SIGNS: ED Triage Vitals  Enc Vitals Group     BP 05/01/19 0924 140/82     Pulse Rate 05/01/19 0924 65     Resp 05/01/19 0924 19     Temp 05/01/19 0924 98.4 F (36.9 C)     Temp Source 05/01/19 0924 Oral     SpO2 05/01/19 0924 98 %     Weight 05/01/19 0927 245 lb (111.1 kg)     Height 05/01/19 0927  (1.803 m)     Head Circumference --      Peak Flow --  Pain Score 05/01/19 0927 0     Pain Loc --      Pain Edu? --      Excl. in GC? --     Constitutional: Awake, good eye contact, not following commands or answering questions HEENT:      Head: Normocephalic and atraumatic.         Eyes: Conjunctivae are normal. Sclera is non-icteric.       Mouth/Throat: Mucous membranes are moist.       Neck: Supple with no signs of meningismus. Cardiovascular: Regular rate and rhythm. No murmurs, gallops, or rubs. 2+ symmetrical distal pulses are present in all extremities. No JVD. Respiratory: Normal respiratory effort. Lungs are clear to auscultation bilaterally. No wheezes, crackles, or rhonchi.  Gastrointestinal: Soft, non tender, and non distended  with positive bowel sounds. No rebound or guarding. Musculoskeletal: No edema, cyanosis, or erythema of extremities. Neurologic: Face is symmetric, open eyes spontaneously, not following commands or speaking. Skin: Skin is warm, dry and intact. No rash noted.  ____________________________________________   LABS (all labs ordered are listed, but only abnormal results are displayed)  Labs Reviewed  LEVETIRACETAM LEVEL   ____________________________________________  EKG  none  ____________________________________________  RADIOLOGY  none  ____________________________________________   PROCEDURES  Procedure(s) performed: None Procedures Critical Care performed:  None ____________________________________________   INITIAL IMPRESSION / ASSESSMENT AND PLAN / ED COURSE  28 y.o. male with a history of epilepsy on Keppra and Zonegran, asthma who presents for evaluation of seizure.  This is patient's eighth presentation to the emergency room since January for seizure. Has not seen his neurologist since December of last year.  Unclear if patient is compliant with his medications.  Unfortunately during all of these visits patient's Keppra level was never tested.  I will send blood for Keppra level at this time.  Patient was here 2 days ago with a seizure and had completely normal labs.  No need to repeat labs at this time.  Is currently postictal.  We will continue to monitor until patient is back to baseline.  Clinical Course as of Apr 30 1399  Fri May 01, 2019  1343 Patient back to baseline with no further seizures in the emergency room.  He was loaded with Keppra.  Mother was concerned that he might have vomited some of the doses over the last week and since he had abdominal pain, constipation and vomiting.  Those symptoms have already resolved.  Neurologist had increased the Keppra to 1000 in the morning and 500 in the evening.  We will go ahead and increase to 1000 twice daily.   Keppra level is pending.  Recommended follow-up with neurologist on Monday for further evaluation.  Discussed seizure precautions with patient and mother.   [CV]    Clinical Course User Index [CV] Don Perking [place], MD     As part of my medical decision making, I reviewed the following data within the electronic MEDICAL RECORD NUMBER History obtained from family, Nursing notes reviewed and incorporated, Labs reviewed , Old chart reviewed, Notes from prior ED visits and [place] Controlled Substance Database    Pertinent labs & imaging results that were available during my care of the patient were reviewed by me and considered in my medical decision making (see chart for details).    ____________________________________________   FINAL CLINICAL IMPRESSION(S) / ED DIAGNOSES  Final diagnoses:  Seizure (HCC)      NEW MEDICATIONS STARTED DURING THIS VISIT:  ED Discharge Orders  None       Note:  This document was prepared using Dragon voice recognition software and may include unintentional dictation errors.    Don PerkingVeronese, [place], MD 05/01/19 1400

## 2019-05-03 LAB — LEVETIRACETAM LEVEL: Levetiracetam Lvl: 10.9 ug/mL (ref 10.0–40.0)

## 2019-05-29 ENCOUNTER — Emergency Department
Admission: EM | Admit: 2019-05-29 | Discharge: 2019-05-29 | Disposition: A | Discharge status: Home / Self Care | Payer: Medicaid Other | Attending: Emergency Medicine | Admitting: Emergency Medicine

## 2019-05-29 ENCOUNTER — Other Ambulatory Visit: Payer: Self-pay

## 2019-05-29 DIAGNOSIS — J45909 Unspecified asthma, uncomplicated: Secondary | ICD-10-CM | POA: Diagnosis not present

## 2019-05-29 DIAGNOSIS — R569 Unspecified convulsions: Secondary | ICD-10-CM | POA: Diagnosis present

## 2019-05-29 LAB — CBC WITH DIFFERENTIAL/PLATELET
Abs Immature Granulocytes: 0.02 10*3/uL (ref 0.00–0.07)
Basophils Absolute: 0 10*3/uL (ref 0.0–0.1)
Basophils Relative: 0 %
Eosinophils Absolute: 0.1 10*3/uL (ref 0.0–0.5)
Eosinophils Relative: 2 %
HCT: 48.5 % (ref 39.0–52.0)
Hemoglobin: 16.2 g/dL (ref 13.0–17.0)
Immature Granulocytes: 0 %
Lymphocytes Relative: 37 %
Lymphs Abs: 1.8 10*3/uL (ref 0.7–4.0)
MCH: 32.4 pg (ref 26.0–34.0)
MCHC: 33.4 g/dL (ref 30.0–36.0)
MCV: 97 fL (ref 80.0–100.0)
Monocytes Absolute: 0.4 10*3/uL (ref 0.1–1.0)
Monocytes Relative: 8 %
Neutro Abs: 2.6 10*3/uL (ref 1.7–7.7)
Neutrophils Relative %: 53 %
Platelets: 228 10*3/uL (ref 150–400)
RBC: 5 MIL/uL (ref 4.22–5.81)
RDW: 12.4 % (ref 11.5–15.5)
WBC: 4.9 10*3/uL (ref 4.0–10.5)
nRBC: 0 % (ref 0.0–0.2)

## 2019-05-29 LAB — COMPREHENSIVE METABOLIC PANEL
ALT: 21 U/L (ref 0–44)
AST: 15 U/L (ref 15–41)
Albumin: 4.5 g/dL (ref 3.5–5.0)
Alkaline Phosphatase: 47 U/L (ref 38–126)
Anion gap: 7 (ref 5–15)
BUN: 11 mg/dL (ref 6–20)
CO2: 24 mmol/L (ref 22–32)
Calcium: 9 mg/dL (ref 8.9–10.3)
Chloride: 107 mmol/L (ref 98–111)
Creatinine, Ser: 1.24 mg/dL (ref 0.61–1.24)
GFR calc Af Amer: >60 mL/min (ref >60)
GFR calc non Af Amer: >60 mL/min (ref >60)
Glucose, Bld: 91 mg/dL (ref 70–99)
Potassium: 3.5 mmol/L (ref 3.5–5.1)
Sodium: 138 mmol/L (ref 135–145)
Total Bilirubin: 0.8 mg/dL (ref 0.3–1.2)
Total Protein: 7.4 g/dL (ref 6.5–8.1)

## 2019-05-29 NOTE — ED Notes (Signed)
EDP in room with patient and Patient's mother Jerline Pain.

## 2019-05-29 NOTE — ED Triage Notes (Signed)
Patient arrived from home by [facility] with the complaint of seizures. Per EMS patient has had multiple medication changes recently. Patient's seizure preset as eye twitching. Vitals WDL per EMS. Jerline Pain [phone] is patient guardian. Per mom patient has Mental disability.

## 2019-05-29 NOTE — ED Provider Notes (Signed)
[facility] Emergency Department Provider Note       Time seen: ----------------------------------------- 9:11 PM on 05/29/2019 -----------------------------------------   I have reviewed the triage vital signs and the nursing notes.  HISTORY   Chief Complaint Seizures  Level V caveat: History/ROS limited by altered mental status, patient is nonverbal at this time  HPI Isaac Vaughn is a 28 y.o. male with a history of asthma, epilepsy who presents to the ED for seizure-like activity.  Patient presents to the ER after being brought by home by ambulance.  Patient reportedly had multiple medication changes recently.  Mom reports seizure-like activity was eyelid twitching.  Mom reports he has some mental disability, has been stressed recently due to the death of a friend.  He has not had any recent sickness  Past Medical History:  Diagnosis Date  . Asthma   . Epilepsy ([place])   . Seizures [facility])     Patient Active Problem List   Diagnosis Date Noted  . Altered mental status 06/07/2016    History reviewed. No pertinent surgical history.  Allergies Other  Social History Social History   Tobacco Use  . Smoking status: Never Smoker  . Smokeless tobacco: Never Used  Substance Use Topics  . Alcohol use: No    Alcohol/week: 0.0 standard drinks  . Drug use: No   Review of Systems Unknown at this time, positive for seizure-like activity  All systems negative/normal/unremarkable except as stated in the HPI  ____________________________________________   PHYSICAL EXAM:  VITAL SIGNS: ED Triage Vitals  Enc Vitals Group     BP 05/29/19 1930 138/88     Pulse Rate 05/29/19 1934 65     Resp 05/29/19 1934 16     Temp 05/29/19 1934 98.3 F (36.8 C)     Temp Source 05/29/19 1934 Oral     SpO2 05/29/19 1932 96 %     Weight 05/29/19 1935 230 lb (104.3 kg)     Height 05/29/19 1935 5\' 10"  (1.778 m)     Head Circumference --      Peak Flow --    Pain Score 05/29/19 1935 0     Pain Loc --      Pain Edu? --      Excl. in [place]? --    Constitutional: Patient is poorly responsive with eyelids fluttering Eyes: Conjunctivae are normal. Normal extraocular movements. ENT      Head: Normocephalic and atraumatic.      Nose: No congestion/rhinnorhea.      Mouth/Throat: Mucous membranes are moist.  Strong gag reflex is noted      Neck: No stridor. Cardiovascular: Normal rate, regular rhythm. No murmurs, rubs, or gallops. Respiratory: Normal respiratory effort without tachypnea nor retractions. Breath sounds are clear and equal bilaterally. No wheezes/rales/rhonchi. Gastrointestinal: Soft and nontender. Normal bowel sounds Musculoskeletal: Nontender with normal range of motion in extremities. No lower extremity tenderness nor edema. Neurologic:  Normal speech and language. No gross focal neurologic deficits are appreciated.  Skin:  Skin is warm, dry and intact. No rash noted. Psychiatric: Mood and affect are normal. Speech and behavior are normal.  ____________________________________________  ED COURSE:  As part of my medical decision making, I reviewed the following data within the [place] History obtained from family if available, nursing notes, old chart and ekg, as well as notes from prior ED visits. Patient presented for seizure-like activity, we will assess with labs and imaging as indicated at this time.  Procedures  Isaac Vaughn was evaluated in Emergency Department on 05/29/2019 for the symptoms described in the history of present illness. He was evaluated in the context of the global COVID-19 pandemic, which necessitated consideration that the patient might be at risk for infection with the SARS-CoV-2 virus that causes COVID-19. Institutional protocols and algorithms that pertain to the evaluation of patients at risk for COVID-19 are in a state of rapid change based on information released by regulatory bodies  including the CDC and federal and state organizations. These policies and algorithms were followed during the patient's care in the ED.  ____________________________________________   LABS (pertinent positives/negatives)  Labs Reviewed  CBC WITH DIFFERENTIAL/PLATELET  COMPREHENSIVE METABOLIC PANEL  URINALYSIS, COMPLETE (UACMP) WITH MICROSCOPIC  ____________________________________________   DIFFERENTIAL DIAGNOSIS   Seizure-like activity, pseudoseizure, anxiety, seizure  FINAL ASSESSMENT AND PLAN  seizure-like activity   Plan: The patient had presented for possible seizure.  Clinically these resembled pseudoseizure and not true epileptic seizures.  Patient's labs are unremarkable.  He appears cleared for outpatient follow-up.   Ulice DashJohnathan E Madalen Gavin, MD    Note: This note was generated in part or whole with voice recognition software. Voice recognition is usually quite accurate but there are transcription errors that can and very often do occur. I apologize for any typographical errors that were not detected and corrected.     Emily FilbertWilliams, [name], MD 05/29/19 2227

## 2019-07-17 ENCOUNTER — Other Ambulatory Visit: Payer: Self-pay

## 2019-07-17 ENCOUNTER — Emergency Department
Admission: EM | Admit: 2019-07-17 | Discharge: 2019-07-17 | Disposition: A | Discharge status: Home / Self Care | Payer: Medicaid Other | Attending: Emergency Medicine | Admitting: Emergency Medicine

## 2019-07-17 DIAGNOSIS — Z79899 Other long term (current) drug therapy: Secondary | ICD-10-CM | POA: Insufficient documentation

## 2019-07-17 DIAGNOSIS — R079 Chest pain, unspecified: Secondary | ICD-10-CM | POA: Insufficient documentation

## 2019-07-17 DIAGNOSIS — J45909 Unspecified asthma, uncomplicated: Secondary | ICD-10-CM | POA: Insufficient documentation

## 2019-07-17 DIAGNOSIS — R0602 Shortness of breath: Secondary | ICD-10-CM | POA: Diagnosis present

## 2019-07-17 LAB — CBC WITH DIFFERENTIAL/PLATELET
Abs Immature Granulocytes: 0.03 10*3/uL (ref 0.00–0.07)
Basophils Absolute: 0 10*3/uL (ref 0.0–0.1)
Basophils Relative: 0 %
Eosinophils Absolute: 0 10*3/uL (ref 0.0–0.5)
Eosinophils Relative: 0 %
HCT: 52.3 % — ABNORMAL HIGH (ref 39.0–52.0)
Hemoglobin: 17.6 g/dL — ABNORMAL HIGH (ref 13.0–17.0)
Immature Granulocytes: 1 %
Lymphocytes Relative: 9 %
Lymphs Abs: 0.6 10*3/uL — ABNORMAL LOW (ref 0.7–4.0)
MCH: 32.5 pg (ref 26.0–34.0)
MCHC: 33.7 g/dL (ref 30.0–36.0)
MCV: 96.7 fL (ref 80.0–100.0)
Monocytes Absolute: 0.1 10*3/uL (ref 0.1–1.0)
Monocytes Relative: 1 %
Neutro Abs: 5.7 10*3/uL (ref 1.7–7.7)
Neutrophils Relative %: 89 %
Platelets: 239 10*3/uL (ref 150–400)
RBC: 5.41 MIL/uL (ref 4.22–5.81)
RDW: 12.2 % (ref 11.5–15.5)
WBC: 6.4 10*3/uL (ref 4.0–10.5)
nRBC: 0 % (ref 0.0–0.2)

## 2019-07-17 LAB — BASIC METABOLIC PANEL
Anion gap: 10 (ref 5–15)
BUN: 13 mg/dL (ref 6–20)
CO2: 21 mmol/L — ABNORMAL LOW (ref 22–32)
Calcium: 9.6 mg/dL (ref 8.9–10.3)
Chloride: 108 mmol/L (ref 98–111)
Creatinine, Ser: 1.15 mg/dL (ref 0.61–1.24)
GFR calc Af Amer: >60 mL/min (ref >60)
GFR calc non Af Amer: >60 mL/min (ref >60)
Glucose, Bld: 125 mg/dL — ABNORMAL HIGH (ref 70–99)
Potassium: 4 mmol/L (ref 3.5–5.1)
Sodium: 139 mmol/L (ref 135–145)

## 2019-07-17 LAB — TROPONIN I (HIGH SENSITIVITY): Troponin I (High Sensitivity): <2 ng/L (ref <18)

## 2019-07-17 MED ORDER — FAMOTIDINE 20 MG PO TABS: 20.0000 mg | ORAL_TABLET | Freq: Every day | ORAL | 1 refills | Status: AC

## 2019-07-17 NOTE — ED Provider Notes (Signed)
[facility] Emergency Department Provider Note  ____________________________________________   I have reviewed the triage vital signs and the nursing notes.   HISTORY  Chief Complaint Shortness of Breath   History limited by: Not Limited   HPI Isaac Vaughn is a 28 y.o. male who presents to the emergency department today because of concern for shortness of breath and chest pain. Symptoms started yesterday. [facility] for the same symptoms. Had EKG and CXR done at that time without concerning findings. When he woke up he continued to have the symptoms. Pain is located on his left chest. Has had some vomiting today as well. Denies any fevers.   Records reviewed. Per medical record review patient has a history of asthma, epilepsy.  Past Medical History:  Diagnosis Date  . Asthma   . Epilepsy (HCC)   . Seizures [facility](HCC)     Patient Active Problem List   Diagnosis Date Noted  . Altered mental status 06/07/2016    No past surgical history on file.  Prior to Admission medications   Medication Sig Start Date End Date Taking? Authorizing Provider  dicyclomine (BENTYL) 20 MG tablet Take 20 mg by mouth every 6 (six) hours as needed for cramping. 04/29/19 05/29/19  [provider]  levETIRAcetam (KEPPRA) 250 MG tablet Take 1 tablet (250 mg total) by mouth 2 (two) times daily. Patient taking differently: Take 500 mg by mouth 2 (two) times daily.  02/12/19   Willy Eddyobinson, Patrick, MD  Midazolam (NAYZILAM) 5 MG/0.1ML SOLN Place 5 mg into the nose as directed. May repeat in other nostril after 10 minutes if needed    [provider]  montelukast (SINGULAIR) 10 MG tablet Take 1 tablet (10 mg total) by mouth daily. 12/11/17   Joni ReiningSmith, Ronald K, PA-C  polyethylene glycol (MIRALAX / GLYCOLAX) 17 g packet Take 17 g by mouth 3 (three) times daily. 04/29/19   [provider]  zonisamide (ZONEGRAN) 100 MG capsule Take 500 mg by mouth at  bedtime.  06/21/15   [provider]  zonisamide (ZONEGRAN) 50 MG capsule Take 1 capsule (50 mg total) by mouth at bedtime. Patient not taking: Reported on 04/23/2019 06/08/16   Auburn BilberryPatel, Shreyang, MD    Allergies Other  Family History  Problem Relation Age of Onset  . Hypertension Mother   . Asthma Mother     Social History Social History   Tobacco Use  . Smoking status: Never Smoker  . Smokeless tobacco: Never Used  Substance Use Topics  . Alcohol use: No    Alcohol/week: 0.0 standard drinks  . Drug use: No    Review of Systems Constitutional: No fever/chills Eyes: No visual changes. ENT: No sore throat. Cardiovascular: Positive for chest pain. Respiratory: Positive for shortness of breath. Gastrointestinal: No abdominal pain.  Positive for vomiting.  Genitourinary: Negative for dysuria. Musculoskeletal: Negative for back pain. Skin: Negative for rash. Neurological: Negative for headaches, focal weakness or numbness.  ____________________________________________   PHYSICAL EXAM:  VITAL SIGNS: ED Triage Vitals  Enc Vitals Group     BP 142/83     Pulse 68     Resp 18     Temp 98.2     Temp src      SpO2 99    Constitutional: Alert and oriented.  Eyes: Conjunctivae are normal.  ENT      Head: Normocephalic and atraumatic.      Nose: No congestion/rhinnorhea.  Mouth/Throat: Mucous membranes are moist.      Neck: No stridor. Hematological/Lymphatic/Immunilogical: No cervical lymphadenopathy. Cardiovascular: Normal rate, regular rhythm.  No murmurs, rubs, or gallops.  Respiratory: Normal respiratory effort without tachypnea nor retractions. Breath sounds are clear and equal bilaterally. No wheezes/rales/rhonchi. Gastrointestinal: Soft and non tender. No rebound. No guarding.  Genitourinary: Deferred Musculoskeletal: Normal range of motion in all extremities. No lower extremity edema. Neurologic:  Normal speech and language. No gross focal  neurologic deficits are appreciated.  Skin:  Skin is warm, dry and intact. No rash noted. Psychiatric: Mood and affect are normal. Speech and behavior are normal. Patient exhibits appropriate insight and judgment.  ____________________________________________    LABS (pertinent positives/negatives)  Trop hs <2 BMP wnl except co2 21, glu 125 CBC wbc 6.4, hgb 17.6, plt 239  ____________________________________________   EKG  I, Nance Pear, attending physician, personally viewed and interpreted this EKG  EKG Time: 1258 Rate: 63 Rhythm: sinus rhythm Axis: normal Intervals: qtc 393 QRS: narrow, q waves II, III, aVF ST changes: no st elevation Impression: abnormal ekg   ____________________________________________    RADIOLOGY  None  ____________________________________________   PROCEDURES  Procedures  ____________________________________________   INITIAL IMPRESSION / ASSESSMENT AND PLAN / ED COURSE  Pertinent labs & imaging results that were available during my care of the patient were reviewed by me and considered in my medical decision making (see chart for details).   Patient presented to the emergency department today with concern for chest pain and some shortness of breath. Was seen at [facility] last night for same. CXR reviewed from [facility] visit with no concerning findings. Lungs are clear here. Blood work was checked without concerning anemia or elevated troponin. At this point do wonder if patient is suffering from asthma exacerbation versus esophagitis. Will plan on discharge.  ____________________________________________   FINAL CLINICAL IMPRESSION(S) / ED DIAGNOSES  Final diagnoses:  Nonspecific chest pain     Note: This dictation was prepared with Dragon dictation. Any transcriptional errors that result from this process are unintentional     Nance Pear, MD 07/17/19 1500

## 2019-07-17 NOTE — ED Triage Notes (Signed)
Pt in via EMS from home d/t recurrent SOB waking pt from sleep. Pt seen at [facility] yesterday d/t SOB/CP. History of seizures. Covid tested more than once recently with negative results. BG 138; 146/102 BP; 67 HR; 100% RA.

## 2019-07-17 NOTE — ED Notes (Signed)
Pt wheeled out to car.  

## 2019-07-17 NOTE — ED Notes (Signed)
Attempting for EKG; pt has a lot of chest hair; dry; will capture once legible.

## 2019-07-17 NOTE — ED Notes (Signed)
Mother at bedside. Denies pt having a legal guardian.

## 2019-07-17 NOTE — Discharge Instructions (Signed)
Please seek medical attention for any high fevers, chest pain, shortness of breath, change in behavior, persistent vomiting, bloody stool or any other new or concerning symptoms.  

## 2019-07-17 NOTE — ED Notes (Addendum)
Pt up to bedside toilet. EKG to [place].

## 2019-07-17 NOTE — ED Notes (Addendum)
Pt/family given brief update. Pt continues to c/o HA and CP. Pt requesting a pain med for these. EDP Archie Balboa notified. Pt resting calmly in bed. States left-sided CP worse when he takes a deep breath. EDP Archie Balboa declines need for 2nd troponin.

## 2020-03-13 ENCOUNTER — Other Ambulatory Visit: Payer: Self-pay

## 2020-03-13 ENCOUNTER — Emergency Department: Payer: Medicaid Other

## 2020-03-13 ENCOUNTER — Inpatient Hospital Stay
Admission: EM | Admit: 2020-03-13 | Discharge: 2020-03-18 | DRG: 101 | Disposition: A | Discharge status: Home / Self Care | Payer: Medicaid Other | Attending: Student | Admitting: Student

## 2020-03-13 DIAGNOSIS — Z825 Family history of asthma and other chronic lower respiratory diseases: Secondary | ICD-10-CM | POA: Diagnosis not present

## 2020-03-13 DIAGNOSIS — R400 Somnolence: Secondary | ICD-10-CM | POA: Diagnosis present

## 2020-03-13 DIAGNOSIS — R569 Unspecified convulsions: Secondary | ICD-10-CM

## 2020-03-13 DIAGNOSIS — F70 Mild intellectual disabilities: Secondary | ICD-10-CM | POA: Diagnosis present

## 2020-03-13 DIAGNOSIS — J45909 Unspecified asthma, uncomplicated: Secondary | ICD-10-CM

## 2020-03-13 DIAGNOSIS — Z79899 Other long term (current) drug therapy: Secondary | ICD-10-CM | POA: Diagnosis not present

## 2020-03-13 DIAGNOSIS — I10 Essential (primary) hypertension: Secondary | ICD-10-CM | POA: Diagnosis present

## 2020-03-13 DIAGNOSIS — R6250 Unspecified lack of expected normal physiological development in childhood: Secondary | ICD-10-CM | POA: Diagnosis present

## 2020-03-13 DIAGNOSIS — R079 Chest pain, unspecified: Secondary | ICD-10-CM

## 2020-03-13 DIAGNOSIS — Z20822 Contact with and (suspected) exposure to covid-19: Secondary | ICD-10-CM | POA: Diagnosis present

## 2020-03-13 DIAGNOSIS — F419 Anxiety disorder, unspecified: Secondary | ICD-10-CM | POA: Diagnosis present

## 2020-03-13 DIAGNOSIS — F22 Delusional disorders: Secondary | ICD-10-CM | POA: Diagnosis present

## 2020-03-13 DIAGNOSIS — R059 Cough, unspecified: Secondary | ICD-10-CM

## 2020-03-13 DIAGNOSIS — G40909 Epilepsy, unspecified, not intractable, without status epilepticus: Principal | ICD-10-CM | POA: Diagnosis present

## 2020-03-13 DIAGNOSIS — Z888 Allergy status to other drugs, medicaments and biological substances status: Secondary | ICD-10-CM | POA: Diagnosis not present

## 2020-03-13 DIAGNOSIS — Z6839 Body mass index (BMI) 39.0-39.9, adult: Secondary | ICD-10-CM | POA: Diagnosis not present

## 2020-03-13 DIAGNOSIS — Z8249 Family history of ischemic heart disease and other diseases of the circulatory system: Secondary | ICD-10-CM

## 2020-03-13 DIAGNOSIS — R05 Cough: Secondary | ICD-10-CM

## 2020-03-13 LAB — CBC
HCT: 48.3 % (ref 39.0–52.0)
Hemoglobin: 16.3 g/dL (ref 13.0–17.0)
MCH: 32.5 pg (ref 26.0–34.0)
MCHC: 33.7 g/dL (ref 30.0–36.0)
MCV: 96.4 fL (ref 80.0–100.0)
Platelets: 216 10*3/uL (ref 150–400)
RBC: 5.01 MIL/uL (ref 4.22–5.81)
RDW: 12.2 % (ref 11.5–15.5)
WBC: 6.3 10*3/uL (ref 4.0–10.5)
nRBC: 0 % (ref 0.0–0.2)

## 2020-03-13 LAB — BASIC METABOLIC PANEL
Anion gap: 6 (ref 5–15)
BUN: 12 mg/dL (ref 6–20)
CO2: 27 mmol/L (ref 22–32)
Calcium: 9.4 mg/dL (ref 8.9–10.3)
Chloride: 104 mmol/L (ref 98–111)
Creatinine, Ser: 1.23 mg/dL (ref 0.61–1.24)
GFR calc Af Amer: >60 mL/min (ref >60)
GFR calc non Af Amer: >60 mL/min (ref >60)
Glucose, Bld: 87 mg/dL (ref 70–99)
Potassium: 4.4 mmol/L (ref 3.5–5.1)
Sodium: 137 mmol/L (ref 135–145)

## 2020-03-13 MED ORDER — SODIUM CHLORIDE 0.9 % IV SOLN
4500.0000 mg | Freq: Once | INTRAVENOUS | Status: DC
  Filled 2020-03-13: qty 45

## 2020-03-13 MED ORDER — LORAZEPAM 2 MG/ML IJ SOLN
1.0000 mg | Freq: Once | INTRAMUSCULAR | Status: DC
  Filled 2020-03-13: qty 1

## 2020-03-13 MED ORDER — MONTELUKAST SODIUM 10 MG PO TABS
10.0000 mg | ORAL_TABLET | Freq: Every day | ORAL | Status: DC
  Administered 2020-03-14 – 2020-03-18 (×5): 10 mg via ORAL
  Filled 2020-03-13 (×5): qty 1

## 2020-03-13 MED ORDER — LEVETIRACETAM IN NACL 1000 MG/100ML IV SOLN
1000.0000 mg | Freq: Two times a day (BID) | INTRAVENOUS | Status: DC
  Administered 2020-03-13 – 2020-03-14 (×3): 1000 mg via INTRAVENOUS
  Filled 2020-03-13 (×5): qty 100

## 2020-03-13 MED ORDER — LORAZEPAM 2 MG/ML IJ SOLN: 2.0000 mg | Freq: Once | INTRAMUSCULAR | Status: DC

## 2020-03-13 MED ORDER — LEVETIRACETAM IN NACL 1500 MG/100ML IV SOLN
4500.0000 mg | Freq: Once | INTRAVENOUS | Status: DC
  Filled 2020-03-13: qty 300

## 2020-03-13 MED ORDER — FAMOTIDINE 20 MG PO TABS
20.0000 mg | ORAL_TABLET | Freq: Every day | ORAL | Status: DC
  Administered 2020-03-14 – 2020-03-18 (×5): 20 mg via ORAL
  Filled 2020-03-13 (×5): qty 1

## 2020-03-13 MED ORDER — LORAZEPAM 2 MG/ML IJ SOLN
INTRAMUSCULAR | Status: AC
  Administered 2020-03-13: 16:00:00 2 mg via INTRAVENOUS
  Filled 2020-03-13: qty 1

## 2020-03-13 MED ORDER — LORAZEPAM 2 MG/ML IJ SOLN
2.0000 mg | Freq: Once | INTRAMUSCULAR | Status: AC
  Administered 2020-03-13: 16:00:00 2 mg via INTRAMUSCULAR

## 2020-03-13 MED ORDER — LORAZEPAM 2 MG/ML IJ SOLN: 2.0000 mg | Freq: Once | INTRAMUSCULAR | Status: AC

## 2020-03-13 MED ORDER — ALBUTEROL SULFATE (2.5 MG/3ML) 0.083% IN NEBU: 2.5000 mg | INHALATION_SOLUTION | Freq: Four times a day (QID) | RESPIRATORY_TRACT | Status: DC | PRN

## 2020-03-13 MED ORDER — SODIUM CHLORIDE 0.9 % IV SOLN
200.0000 mg | Freq: Two times a day (BID) | INTRAVENOUS | Status: DC
  Administered 2020-03-13 – 2020-03-14 (×3): 200 mg via INTRAVENOUS
  Filled 2020-03-13 (×5): qty 20

## 2020-03-13 MED ORDER — ENOXAPARIN SODIUM 40 MG/0.4ML ~~LOC~~ SOLN
40.0000 mg | SUBCUTANEOUS | Status: DC
  Administered 2020-03-13 – 2020-03-17 (×5): 40 mg via SUBCUTANEOUS
  Filled 2020-03-13 (×5): qty 0.4

## 2020-03-13 MED ORDER — LEVETIRACETAM IN NACL 1000 MG/100ML IV SOLN
2000.0000 mg | Freq: Once | INTRAVENOUS | Status: AC
  Administered 2020-03-13: 17:00:00 2000 mg via INTRAVENOUS
  Filled 2020-03-13: qty 200

## 2020-03-13 MED ORDER — SODIUM CHLORIDE 0.9 % IV SOLN
2000.0000 mg | Freq: Once | INTRAVENOUS | Status: DC
  Filled 2020-03-13: qty 20

## 2020-03-13 MED ORDER — SODIUM CHLORIDE 0.9 % IV SOLN
75.0000 mL/h | INTRAVENOUS | Status: DC
  Administered 2020-03-13: 22:00:00 75 mL/h via INTRAVENOUS

## 2020-03-13 NOTE — ED Notes (Signed)
Pt transported to CT ?

## 2020-03-13 NOTE — ED Provider Notes (Signed)
[facility] Emergency Department Provider Note   ____________________________________________   First MD Initiated Contact with Patient 03/13/20 1501     (approximate)  I have reviewed the triage vital signs and the nursing notes.   HISTORY  Chief Complaint Seizures    HPI Isaac STETZER is a 29 y.o. male with past medical history of developmental disability, seizures, and asthma who presents to the ED for seizure.  History is limited due to patient's apparent postictal state.  Per EMS, patient was witnessed by his brother to have fallen to the ground with possible seizure activity.  His mom then came outside when the patient seemed awake and alert, but he complained of his tongue tingling, which often happens before he is about to have a seizure.  He did not seem to have another seizure episode in front of mom, where he fell to the ground and had generalized tonic-clonic seizure activity lasting no more than 2 minutes.  EMS arrived and patient remained postictal during transportation.  Mom states that patient has not missed any of his seizure medications, typically takes Keppra and Vimpat as prescribed by [facility] neurology.  He has otherwise been feeling well recently with no fevers, cough, chest pain, or shortness of breath.        Past Medical History:  Diagnosis Date  . Asthma   . Epilepsy (HCC)   . Seizures [facility])     Patient Active Problem List   Diagnosis Date Noted  . Altered mental status 06/07/2016    History reviewed. No pertinent surgical history.  Prior to Admission medications   Medication Sig Start Date End Date Taking? Authorizing Provider  dicyclomine (BENTYL) 20 MG tablet Take 20 mg by mouth every 6 (six) hours as needed for cramping. 04/29/19 05/29/19  [provider]  famotidine (PEPCID) 20 MG tablet Take 1 tablet (20 mg total) by mouth daily. 07/17/19 07/16/20  Phineas Semen, MD  levETIRAcetam (KEPPRA) 250 MG tablet Take 1  tablet (250 mg total) by mouth 2 (two) times daily. Patient taking differently: Take 500 mg by mouth 2 (two) times daily.  02/12/19   Willy Eddy, MD  Midazolam (NAYZILAM) 5 MG/0.1ML SOLN Place 5 mg into the nose as directed. May repeat in other nostril after 10 minutes if needed    [provider]  montelukast (SINGULAIR) 10 MG tablet Take 1 tablet (10 mg total) by mouth daily. 12/11/17   Joni Reining, PA-C  polyethylene glycol (MIRALAX / GLYCOLAX) 17 g packet Take 17 g by mouth 3 (three) times daily. 04/29/19   [provider]  zonisamide (ZONEGRAN) 100 MG capsule Take 500 mg by mouth at bedtime.  06/21/15   [provider]  zonisamide (ZONEGRAN) 50 MG capsule Take 1 capsule (50 mg total) by mouth at bedtime. Patient not taking: Reported on 04/23/2019 06/08/16   Auburn Bilberry, MD    Allergies Other  Family History  Problem Relation Age of Onset  . Hypertension Mother   . Asthma Mother     Social History Social History   Tobacco Use  . Smoking status: Never Smoker  . Smokeless tobacco: Never Used  Substance Use Topics  . Alcohol use: No    Alcohol/week: 0.0 standard drinks  . Drug use: No    Review of Systems Unable to obtain secondary to altered mental status.  ____________________________________________   PHYSICAL EXAM:  VITAL SIGNS: ED Triage Vitals  Enc Vitals Group     BP  Pulse      Resp      Temp      Temp src      SpO2      Weight      Height      Head Circumference      Peak Flow      Pain Score      Pain Loc      Pain Edu?      Excl. in [place]?     Constitutional: Somnolent and difficult to arouse. Eyes: Conjunctivae are normal.  Pupils equal round and reactive to light bilaterally. Head: Atraumatic. Nose: No congestion/rhinnorhea. Mouth/Throat: Mucous membranes are moist. Neck: Normal ROM Cardiovascular: Normal rate, regular rhythm. Grossly normal heart sounds. Respiratory: Normal respiratory effort.  No  retractions. Lungs CTAB. Gastrointestinal: Soft and nontender. No distention. Genitourinary: deferred Musculoskeletal: No lower extremity tenderness nor edema. Neurologic: Withdraws from pain in all 4 extremities, does not follow commands. Skin:  Skin is warm, dry and intact. No rash noted. Psychiatric: Unable to assess.  ____________________________________________   LABS (all labs ordered are listed, but only abnormal results are displayed)  Labs Reviewed  SARS CORONAVIRUS 2 (TAT 6-24 HRS)  BASIC METABOLIC PANEL  CBC  LACOSAMIDE  LEVETIRACETAM LEVEL   ____________________________________________  EKG  ED ECG REPORT I, Blake Divine, the attending physician, personally viewed and interpreted this ECG.   Date: 03/13/2020  EKG Time: 16:00  Rate: 71  Rhythm: normal sinus rhythm  Axis: Normal  Intervals:none  ST&T Change: Anterolateral Q waves, similar to prior   PROCEDURES  Procedure(s) performed (including Critical Care):  .1-3 Lead EKG Interpretation Performed by: Blake Divine, MD Authorized by: Blake Divine, MD     Interpretation: normal     ECG rate:  75   ECG rate assessment: normal     Rhythm: sinus rhythm     Ectopy: none     Conduction: normal   .Critical Care Performed by: Blake Divine, MD Authorized by: Blake Divine, MD   Critical care provider statement:    Critical care time (minutes):  45   Critical care time was exclusive of:  Separately billable procedures and treating other patients and teaching time   Critical care was necessary to treat or prevent imminent or life-threatening deterioration of the following conditions:  CNS failure or compromise   Critical care was time spent personally by me on the following activities:  Discussions with consultants, evaluation of patient's response to treatment, examination of patient, ordering and performing treatments and interventions, ordering and review of laboratory studies, ordering and  review of radiographic studies, pulse oximetry, re-evaluation of patient's condition, obtaining history from patient or surrogate and review of old charts   I assumed direction of critical care for this patient from another provider in my specialty: no       ____________________________________________   INITIAL IMPRESSION / ASSESSMENT AND PLAN / ED COURSE       29 year old male presents to the ED after 2 apparent seizure episodes at home, 1 which where he fell to the ground and struck his head.  He remained somnolent upon arrival here in the ED, briefly seem to be able to follow commands but then was noted to have abnormal eye movements with some twitching of his right upper extremity.  Given concern for subclinical status, patient was given 2 mg of IM Ativan and then 2 mg of IV Ativan once IV access established.  Case was discussed with Dr. Irish Elders of neurology, who  recommends loading with 2 g of Keppra IV as well as increase of Vimpat dose to 200 mg twice daily.  We will check levels of both these medications.  Following administration of Ativan, patient seems to have gradual improvement in his mental status, now more arousable to sternal rub and starting to follow commands once aroused.  We will continue to monitor closely for any recurrent seizure activity.  Patient with gradual improvement in mental status, now awake and able to interact verbally.  CT head and C-spine are negative for acute process.  Case discussed with hospitalist for admission.      ____________________________________________   FINAL CLINICAL IMPRESSION(S) / ED DIAGNOSES  Final diagnoses:  Seizures (HCC)  Developmental delay     ED Discharge Orders    None       Note:  This document was prepared using Dragon voice recognition software and may include unintentional dictation errors.   Chesley Noon, MD 03/13/20 [phone]

## 2020-03-13 NOTE — ED Notes (Signed)
Mother called out states that pt is having CP and sob. Pt is still very somnolent and is opening his eyes when I speak with him but not gjiving me a pain score.  Pt appears in no distress. Spo2 100% on RA.  EKG done.  Admitting MD notified

## 2020-03-13 NOTE — ED Triage Notes (Signed)
Pt arrives via EMS from home after having a seizure- pt brother states he hit the back of his head- per EMS no injury noted- pt has a hx of seizures and meds are administered by mom- pt is developmentally delayed

## 2020-03-13 NOTE — ED Notes (Signed)
Call to 2A 

## 2020-03-13 NOTE — ED Notes (Signed)
Report received from Ariel, RN 

## 2020-03-13 NOTE — H&P (Signed)
History and Physical    Isaac Vaughn DOB: 28-Feb-1991 DOA: 03/13/2020  PCP: [facility]  Patient coming from: Home, lives with mom and brother  I have personally briefly reviewed patient's old medical records in [facility]  Chief Complaint: Seizure  HPI: Isaac Vaughn is a 29 y.o. male with medical history significant for seizure, asthma, mild intellectual disability who presents with concerns of seizure.  Patient reportedly told brother to get help when he was outside and brother saw him fall backwards.  Then brother got his mother for help.  Mom found patient laying on his side on the ground and patient told her that he felt fatigued which she knows is usually a sign he has with seizures.  She then decided to call 911 during which she saw his eyes twitch and hands began to shake. On arrival in the ED, he was again noted to have twitching right hand tremors.  Per mother, patient's last seizure was a few months ago.  At that time he had  his Keppra increased and he also recently had his Zonigan switched to Vimpat 50mg  TID because he was having nausea symptoms and would vomit medication up at times.   ED Course: He was afebrile, normotensive and tachypnea at times. He was given Ativan 2mg  x 2 following seizure activity noted in the ER. ER physician Dr. discussed with neurology and they recommended loading dose of Keppra and increasing Vimpat to 200BID.   Patient also later complained to mother regarding chest pain and shortness of breath. I reviewed his EKG shortly after and it was NSR without any T waves or ST changes. His O2 saturation was at 100% on room air.   CBC and BMP were unremarkable. CT head negative except for small right frontoparietal scalp hematoma. CT cervical spine negative.   Review of Systems: Unable to fully obtain given pt was in post-ictal state. He did complain of left sided chest pain.   Past Medical History:  Diagnosis  Date  . Asthma   . Epilepsy (HCC)   . Seizures (HCC)     History reviewed. No pertinent surgical history.   reports that he has never smoked. He has never used smokeless tobacco. He reports that he does not drink alcohol or use drugs.  Allergies  Allergen Reactions  . Other Other (See Comments)    Seasonal allergies: runny nose, congestion, watery eyes, sneezing    Family History  Problem Relation Age of Onset  . Hypertension Mother   . Asthma Mother      Prior to Admission medications   Medication Sig Start Date End Date Taking? Authorizing Provider  albuterol (VENTOLIN HFA) 108 (90 Base) MCG/ACT inhaler Inhale 2 puffs into the lungs every 6 (six) hours as needed for wheezing or shortness of breath.   Yes [provider]  dicyclomine (BENTYL) 20 MG tablet Take 20 mg by mouth every 6 (six) hours as needed for cramping. 04/29/19 03/13/20 Yes [provider]  famotidine (PEPCID) 20 MG tablet Take 1 tablet (20 mg total) by mouth daily. 07/17/19 07/16/20 Yes 07/19/19, MD  levETIRAcetam [facility]) 100 MG/ML solution SMARTSIG:2 Teaspoon By Mouth Twice Daily 02/13/20  Yes [provider]  Midazolam (NAYZILAM) 5 MG/0.1ML SOLN Place 5 mg into the nose as directed. May repeat in other nostril after 10 minutes if needed   Yes [provider]  montelukast (SINGULAIR) 10 MG tablet Take 1 tablet (10 mg total) by mouth daily.  12/11/17  Yes Sable Feil, PA-C  ondansetron (ZOFRAN-ODT) 4 MG disintegrating tablet Take by mouth. 05/04/19  Yes [provider]  polyethylene glycol (MIRALAX / GLYCOLAX) 17 g packet Take 17 g by mouth 3 (three) times daily. 04/29/19  Yes [provider]  VIMPAT 50 MG TABS tablet Take 150 mg by mouth 2 (two) times daily. 02/29/20  Yes [provider]    Physical Exam: Vitals:   03/13/20 1830 03/13/20 1900 03/13/20 1930 03/13/20 2000  BP: (!) 141/88 (!) 142/87 140/84 (!) 145/84  Pulse: 84 100 85 83  Resp:  (!) 22 (!) 25 (!) 25 (!) 23  Temp:      TempSrc:      SpO2: 97% 98% 99% 98%  Weight:      Height:        Constitutional:lethargic young male laying flat in bed  Vitals:   03/13/20 1830 03/13/20 1900 03/13/20 1930 03/13/20 2000  BP: (!) 141/88 (!) 142/87 140/84 (!) 145/84  Pulse: 84 100 85 83  Resp: (!) 22 (!) 25 (!) 25 (!) 23  Temp:      TempSrc:      SpO2: 97% 98% 99% 98%  Weight:      Height:       Eyes: PERRL, lids and conjunctivae normal ENMT: Mucous membranes are moist.  Neck: normal, supple Respiratory: clear to auscultation bilaterally, no wheezing, no crackles. Normal respiratory effort on room air. No accessory muscle use.  Cardiovascular: Regular rate and rhythm, no murmurs / rubs / gallops. No extremity edema. Left sided sternal chest wall pain with palpation. Abdomen: no tenderness, no masses palpated.  Bowel sounds positive.  Musculoskeletal: no clubbing / cyanosis. No joint deformity upper and lower extremities.   Skin: no rashes, lesions, ulcers. No induration Neurologic: Pt lethargic. Mumbles when asked questions.Able to do weak hand grip but not able to follow much other commands.  Psychiatric: Lethargic    Labs on Admission: I have personally reviewed following labs and imaging studies  CBC: Recent Labs  Lab 03/13/20 1511  WBC 6.3  HGB 16.3  HCT 48.3  MCV 96.4  PLT 528   Basic Metabolic Panel: Recent Labs  Lab 03/13/20 1511  NA 137  K 4.4  CL 104  CO2 27  GLUCOSE 87  BUN 12  CREATININE 1.23  CALCIUM 9.4   GFR: Estimated Creatinine Clearance: 114.1 mL/min (by C-G formula based on SCr of 1.23 mg/dL). Liver Function Tests: No results for input(s): AST, ALT, ALKPHOS, BILITOT, PROT, ALBUMIN in the last 168 hours. No results for input(s): LIPASE, AMYLASE in the last 168 hours. No results for input(s): AMMONIA in the last 168 hours. Coagulation Profile: No results for input(s): INR, PROTIME in the last 168 hours. Cardiac Enzymes: No results  for input(s): CKTOTAL, CKMB, CKMBINDEX, TROPONINI in the last 168 hours. BNP (last 3 results) No results for input(s): PROBNP in the last 8760 hours. HbA1C: No results for input(s): HGBA1C in the last 72 hours. CBG: No results for input(s): GLUCAP in the last 168 hours. Lipid Profile: No results for input(s): CHOL, HDL, LDLCALC, TRIG, CHOLHDL, LDLDIRECT in the last 72 hours. Thyroid Function Tests: No results for input(s): TSH, T4TOTAL, FREET4, T3FREE, THYROIDAB in the last 72 hours. Anemia Panel: No results for input(s): VITAMINB12, FOLATE, FERRITIN, TIBC, IRON, RETICCTPCT in the last 72 hours. Urine analysis:    Component Value Date/Time   COLORURINE STRAW (A) 04/21/2019 2148   APPEARANCEUR CLEAR (A) 04/21/2019 2148  APPEARANCEUR Hazy 07/04/2012 1304   LABSPEC 1.005 04/21/2019 2148   LABSPEC 1.025 07/04/2012 1304   PHURINE 8.0 04/21/2019 2148   GLUCOSEU NEGATIVE 04/21/2019 2148   GLUCOSEU 150 mg/dL 48/54/6270 3500   HGBUR NEGATIVE 04/21/2019 2148   BILIRUBINUR NEGATIVE 04/21/2019 2148   BILIRUBINUR Negative 07/04/2012 1304   KETONESUR NEGATIVE 04/21/2019 2148   PROTEINUR NEGATIVE 04/21/2019 2148   NITRITE NEGATIVE 04/21/2019 2148   LEUKOCYTESUR NEGATIVE 04/21/2019 2148   LEUKOCYTESUR Negative 07/04/2012 1304    Radiological Exams on Admission: CT Head Wo Contrast  Result Date: 03/13/2020 CLINICAL DATA:  History of trauma to back of head. EXAM: CT HEAD WITHOUT CONTRAST CT CERVICAL SPINE WITHOUT CONTRAST TECHNIQUE: Multidetector CT imaging of the head and cervical spine was performed following the standard protocol without intravenous contrast. Multiplanar CT image reconstructions of the cervical spine were also generated. COMPARISON:  04/22/2019 FINDINGS: CT HEAD FINDINGS Brain: No evidence of acute infarction, hemorrhage, hydrocephalus, extra-axial collection or mass lesion/mass effect. Vascular: No hyperdense vessel or unexpected calcification. Skull: Normal. Negative for  fracture or focal lesion. Sinuses/Orbits: Visualized paranasal sinuses and orbits are unremarkable. Other: Small scalp hematoma along the right frontoparietal scalp near the vertex. CT CERVICAL SPINE FINDINGS Alignment: Mild straightening of normal cervical lordosis. May be positional or related to spasm. Skull base and vertebrae: No acute fracture. No primary bone lesion or focal pathologic process. Soft tissues and spinal canal: No prevertebral fluid or swelling. No visible canal hematoma. Disc levels: Mild spinal degenerative changes at C4-5, C5-6 and C6-7. Upper chest: Negative. Other: None IMPRESSION: 1. No acute intracranial abnormality. 2. Small right frontoparietal scalp hematoma near the vertex. 3. No acute fracture or subluxation of the cervical spine. Electronically Signed   By: Donzetta Kohut M.D.   On: 03/13/2020 17:31   CT Cervical Spine Wo Contrast  Result Date: 03/13/2020 CLINICAL DATA:  History of trauma to back of head. EXAM: CT HEAD WITHOUT CONTRAST CT CERVICAL SPINE WITHOUT CONTRAST TECHNIQUE: Multidetector CT imaging of the head and cervical spine was performed following the standard protocol without intravenous contrast. Multiplanar CT image reconstructions of the cervical spine were also generated. COMPARISON:  04/22/2019 FINDINGS: CT HEAD FINDINGS Brain: No evidence of acute infarction, hemorrhage, hydrocephalus, extra-axial collection or mass lesion/mass effect. Vascular: No hyperdense vessel or unexpected calcification. Skull: Normal. Negative for fracture or focal lesion. Sinuses/Orbits: Visualized paranasal sinuses and orbits are unremarkable. Other: Small scalp hematoma along the right frontoparietal scalp near the vertex. CT CERVICAL SPINE FINDINGS Alignment: Mild straightening of normal cervical lordosis. May be positional or related to spasm. Skull base and vertebrae: No acute fracture. No primary bone lesion or focal pathologic process. Soft tissues and spinal canal: No  prevertebral fluid or swelling. No visible canal hematoma. Disc levels: Mild spinal degenerative changes at C4-5, C5-6 and C6-7. Upper chest: Negative. Other: None IMPRESSION: 1. No acute intracranial abnormality. 2. Small right frontoparietal scalp hematoma near the vertex. 3. No acute fracture or subluxation of the cervical spine. Electronically Signed   By: Donzetta Kohut M.D.   On: 03/13/2020 17:31    EKG: Independently reviewed.   Assessment/Plan  Seizure with hx of same increase Vimpat to 200BID per neuro continue daily Keppra  frequent neurochecks seizure precaution keep NPO for now while post-ictal   Chest pain Suspect MSK related from fall especially with chest wall pain on palpation. EKG NSR. No cardiac risk factors.  Monitor  Asthma continue Singular when able  Mild intellectual disability normally conversive  DVT prophylaxis:.Lovenox Code Status: Full Family Communication: Plan discussed with mother at bedside  disposition Plan: Home with at least 2 midnight stays  Consults called:  Admission status: inpatient requiring close neuro monitoring for seizure   Anselm Jungling DO [facility]   If 7PM-7AM, please contact night-coverage [email or web address]   03/13/2020, 8:08 PM

## 2020-03-13 NOTE — Consult Note (Signed)
MEDICATION RELATED CONSULT NOTE   Pharmacy Consult for Drug interaction and monitoring of antiepileptic medications   Allergies  Allergen Reactions  . Other Other (See Comments)    Seasonal allergies: runny nose, congestion, watery eyes, sneezing    Patient Measurements: Height: 6' (182.9 cm) Weight: 113.4 kg (250 lb) IBW/kg (Calculated) : 77.6  Vital Signs: Temp: 98.4 F (36.9 C) (04/11 2127) Temp Source: Oral (04/11 1506) BP: 146/105 (04/11 2127) Pulse Rate: 92 (04/11 2127) Intake/Output from previous day: No intake/output data recorded. Intake/Output from this shift: No intake/output data recorded.  Labs: Recent Labs    03/13/20 1511  WBC 6.3  HGB 16.3  HCT 48.3  PLT 216  CREATININE 1.23   Estimated Creatinine Clearance: 114.1 mL/min (by C-G formula based on SCr of 1.23 mg/dL).   Assessment: Pharmacy has been consulted for AED monitoring for drug interactions. There are no drug interactions at this time.    Plan:  Pharmacy will continue to monitor for drug interactions.   Katha Cabal 03/13/2020,9:32 PM

## 2020-03-13 NOTE — Plan of Care (Signed)

## 2020-03-14 DIAGNOSIS — R569 Unspecified convulsions: Secondary | ICD-10-CM

## 2020-03-14 LAB — HIV ANTIBODY (ROUTINE TESTING W REFLEX): HIV Screen 4th Generation wRfx: NONREACTIVE

## 2020-03-14 LAB — SARS CORONAVIRUS 2 (TAT 6-24 HRS): SARS Coronavirus 2: NEGATIVE

## 2020-03-14 MED ORDER — HYDROCORTISONE ACETATE 25 MG RE SUPP
25.0000 mg | Freq: Two times a day (BID) | RECTAL | Status: DC
  Administered 2020-03-14 – 2020-03-17 (×2): 25 mg via RECTAL
  Filled 2020-03-14 (×12): qty 1

## 2020-03-14 MED ORDER — ACETAMINOPHEN 325 MG PO TABS
650.0000 mg | ORAL_TABLET | Freq: Four times a day (QID) | ORAL | Status: DC | PRN
  Administered 2020-03-14: 18:00:00 650 mg via ORAL
  Filled 2020-03-14: qty 2

## 2020-03-14 MED ORDER — BUTALBITAL-APAP-CAFFEINE 50-325-40 MG PO TABS
2.0000 | ORAL_TABLET | Freq: Four times a day (QID) | ORAL | Status: DC | PRN
  Administered 2020-03-15 – 2020-03-16 (×2): 2 via ORAL
  Filled 2020-03-14 (×2): qty 2

## 2020-03-14 NOTE — Consult Note (Signed)
Reason for Consult:seizure activity  Requesting Physician: Dr. Allena Katz   CC: seizure  HPI: Isaac Vaughn is an 29 y.o. male with medical history significant for seizure, asthma, mild intellectual disability who presents with seizure activity. Patient reportedly told brother to get help when he was outside and brother saw him fall backwards.   Mom found patient laying on his side on the ground and patient told her that he felt fatigued which she knows is usually a sign he has with seizures.  She then decided to call 911 during which she saw his eyes twitch and hands began to shake. On arrival in the ED, he was again noted to have twitching right hand tremors.  Per mother, patient's last seizure was a few months ago.  At that time he had  his Keppra increased and he also recently had zonigram switched to Vimpat 150mg  TID.  Information obtained form mother as patient is somnolent. S/p load of Keppra 2g in ED and started on Vimpat 200 BID   Past Medical History:  Diagnosis Date  . Asthma   . Epilepsy (HCC)   . Seizures (HCC)     History reviewed. No pertinent surgical history.  Family History  Problem Relation Age of Onset  . Hypertension Mother   . Asthma Mother     Social History:  reports that he has never smoked. He has never used smokeless tobacco. He reports that he does not drink alcohol or use drugs.  Allergies  Allergen Reactions  . Other Other (See Comments)    Seasonal allergies: runny nose, congestion, watery eyes, sneezing    Medications: I have reviewed the patient's current medications.  ROS: Unable to obtain as somnolent  Physical Examination: Blood pressure (!) 145/82, pulse 72, temperature 98 F (36.7 C), temperature source Oral, resp. rate 18, height 6' (1.829 m), weight 131.7 kg, SpO2 100 %.  Patient doesn't follow commands Does withdraw from painful stimuli Resists me opening his eyes Responds to visual threats  As per mom was talking to her earlier.     Laboratory Studies:   Basic Metabolic Panel: Recent Labs  Lab 03/13/20 1511  NA 137  K 4.4  CL 104  CO2 27  GLUCOSE 87  BUN 12  CREATININE 1.23  CALCIUM 9.4    Liver Function Tests: No results for input(s): AST, ALT, ALKPHOS, BILITOT, PROT, ALBUMIN in the last 168 hours. No results for input(s): LIPASE, AMYLASE in the last 168 hours. No results for input(s): AMMONIA in the last 168 hours.  CBC: Recent Labs  Lab 03/13/20 1511  WBC 6.3  HGB 16.3  HCT 48.3  MCV 96.4  PLT 216    Cardiac Enzymes: No results for input(s): CKTOTAL, CKMB, CKMBINDEX, TROPONINI in the last 168 hours.  BNP: Invalid input(s): POCBNP  CBG: No results for input(s): GLUCAP in the last 168 hours.  Microbiology: Results for orders placed or performed during the hospital encounter of 03/13/20  SARS CORONAVIRUS 2 (TAT 6-24 HRS) Nasopharyngeal Nasopharyngeal Swab     Status: None   Collection Time: 03/13/20  4:02 PM   Specimen: Nasopharyngeal Swab  Result Value Ref Range Status   SARS Coronavirus 2 NEGATIVE NEGATIVE Final    Comment: (NOTE) SARS-CoV-2 target nucleic acids are NOT DETECTED. The SARS-CoV-2 RNA is generally detectable in upper and lower respiratory specimens during the acute phase of infection. Negative results do not preclude SARS-CoV-2 infection, do not rule out co-infections with other pathogens, and should not be used as  the sole basis for treatment or other patient management decisions. Negative results must be combined with clinical observations, patient history, and epidemiological information. The expected result is Negative. Fact Sheet for Patients: HairSlick.no Fact Sheet for Healthcare Providers: quierodirigir.com This test is not yet approved or cleared by the [place] FDA and  has been authorized for detection and/or diagnosis of SARS-CoV-2 by FDA under an Emergency Use Authorization (EUA). This EUA  will remain  in effect (meaning this test can be used) for the duration of the COVID-19 declaration under Section 56 4(b)(1) of the Act, 21 U.S.C. section 360bbb-3(b)(1), unless the authorization is terminated or revoked sooner. Performed at [facility], [address], [place], [place] [address]     Coagulation Studies: No results for input(s): LABPROT, INR in the last 72 hours.  Urinalysis: No results for input(s): COLORURINE, LABSPEC, PHURINE, GLUCOSEU, HGBUR, BILIRUBINUR, KETONESUR, PROTEINUR, UROBILINOGEN, NITRITE, LEUKOCYTESUR in the last 168 hours.  Invalid input(s): APPERANCEUR  Lipid Panel:  No results found for: CHOL, TRIG, HDL, CHOLHDL, VLDL, LDLCALC  HgbA1C: No results found for: HGBA1C  Urine Drug Screen:      Component Value Date/Time   LABOPIA NONE DETECTED 04/23/2019 1439   COCAINSCRNUR NONE DETECTED 04/23/2019 1439   LABBENZ NONE DETECTED 04/23/2019 1439   AMPHETMU NONE DETECTED 04/23/2019 1439   THCU NONE DETECTED 04/23/2019 1439   LABBARB NONE DETECTED 04/23/2019 1439    Alcohol Level: No results for input(s): ETH in the last 168 hours.  Other results: EKG: normal EKG, normal sinus rhythm, unchanged from previous tracings.  Imaging: CT Head Wo Contrast  Result Date: 03/13/2020 CLINICAL DATA:  History of trauma to back of head. EXAM: CT HEAD WITHOUT CONTRAST CT CERVICAL SPINE WITHOUT CONTRAST TECHNIQUE: Multidetector CT imaging of the head and cervical spine was performed following the standard protocol without intravenous contrast. Multiplanar CT image reconstructions of the cervical spine were also generated. COMPARISON:  04/22/2019 FINDINGS: CT HEAD FINDINGS Brain: No evidence of acute infarction, hemorrhage, hydrocephalus, extra-axial collection or mass lesion/mass effect. Vascular: No hyperdense vessel or unexpected calcification. Skull: Normal. Negative for fracture or focal lesion. Sinuses/Orbits: Visualized paranasal sinuses and orbits are  unremarkable. Other: Small scalp hematoma along the right frontoparietal scalp near the vertex. CT CERVICAL SPINE FINDINGS Alignment: Mild straightening of normal cervical lordosis. May be positional or related to spasm. Skull base and vertebrae: No acute fracture. No primary bone lesion or focal pathologic process. Soft tissues and spinal canal: No prevertebral fluid or swelling. No visible canal hematoma. Disc levels: Mild spinal degenerative changes at C4-5, C5-6 and C6-7. Upper chest: Negative. Other: None IMPRESSION: 1. No acute intracranial abnormality. 2. Small right frontoparietal scalp hematoma near the vertex. 3. No acute fracture or subluxation of the cervical spine. Electronically Signed   By: Donzetta Kohut M.D.   On: 03/13/2020 17:31   CT Cervical Spine Wo Contrast  Result Date: 03/13/2020 CLINICAL DATA:  History of trauma to back of head. EXAM: CT HEAD WITHOUT CONTRAST CT CERVICAL SPINE WITHOUT CONTRAST TECHNIQUE: Multidetector CT imaging of the head and cervical spine was performed following the standard protocol without intravenous contrast. Multiplanar CT image reconstructions of the cervical spine were also generated. COMPARISON:  04/22/2019 FINDINGS: CT HEAD FINDINGS Brain: No evidence of acute infarction, hemorrhage, hydrocephalus, extra-axial collection or mass lesion/mass effect. Vascular: No hyperdense vessel or unexpected calcification. Skull: Normal. Negative for fracture or focal lesion. Sinuses/Orbits: Visualized paranasal sinuses and orbits are unremarkable. Other: Small scalp hematoma along the  right frontoparietal scalp near the vertex. CT CERVICAL SPINE FINDINGS Alignment: Mild straightening of normal cervical lordosis. May be positional or related to spasm. Skull base and vertebrae: No acute fracture. No primary bone lesion or focal pathologic process. Soft tissues and spinal canal: No prevertebral fluid or swelling. No visible canal hematoma. Disc levels: Mild spinal  degenerative changes at C4-5, C5-6 and C6-7. Upper chest: Negative. Other: None IMPRESSION: 1. No acute intracranial abnormality. 2. Small right frontoparietal scalp hematoma near the vertex. 3. No acute fracture or subluxation of the cervical spine. Electronically Signed   By: Zetta Bills M.D.   On: 03/13/2020 17:31     Assessment/Plan: 29 y.o. male with medical history significant for seizure, asthma, mild intellectual disability who presents with seizure activity. Patient reportedly told brother to get help when he was outside and brother saw him fall backwards.   Mom found patient laying on his side on the ground and patient told her that he felt fatigued which she knows is usually a sign he has with seizures.  She then decided to call 911 during which she saw his eyes twitch and hands began to shake. On arrival in the ED, he was again noted to have twitching right hand tremors.  - On vimat and Keppra  - as per mom sleepy after last keppra dose - possibly try to stagger vimpat 200 BID and Keppra 1gm BID - does not appear like he is seizing - EEG done and awaiting results - if no improvement tomorrow might obtain MRI  - d/w mother at bedside.

## 2020-03-14 NOTE — Progress Notes (Signed)
eeg completed ° °

## 2020-03-14 NOTE — Progress Notes (Signed)
[facility]  - [place] at [facility]   PATIENT NAME: Isaac Vaughn    MR#:  [id number]  DATE OF BIRTH:  05-Apr-1991  SUBJECTIVE:  mother in the room patient just got back from EEG flickering his eyelids. Opens eyes however closes immediately. No active seizures noted. Did receive high dose of Keppra and Ativan.  REVIEW OF SYSTEMS:   Review of Systems  Unable to perform ROS: Medical condition   Tolerating Diet: Tolerating PT:   DRUG ALLERGIES:   Allergies  Allergen Reactions  . Other Other (See Comments)    Seasonal allergies: runny nose, congestion, watery eyes, sneezing    VITALS:  Blood pressure (!) 151/95, pulse 74, temperature 98 F (36.7 C), temperature source Oral, resp. rate 18, height 6' (1.829 m), weight 131.7 kg, SpO2 100 %.  PHYSICAL EXAMINATION:   Physical Examlimited exam  GENERAL:  29 y.o.-year-old patient lying in the bed with no acute distress. obese EYES: Pupils equal, round, reactive to light and accommodation. No scleral icterus.   HEENT: Head atraumatic, normocephalic. Oropharynx and nasopharynx clear.  LUNGS: Normal breath sounds bilaterally, no wheezing, rales, rhonchi. No use of accessory muscles of respiration.  CARDIOVASCULAR: S1, S2 normal. No murmurs, rubs, or gallops.  ABDOMEN: Soft, nontender, nondistended. Bowel sounds present. No organomegaly or mass.  EXTREMITIES: No cyanosis, clubbing or edema b/l.    NEUROLOGIC: unable to assess. Patient appears postictal  PSYCHIATRIC:  patient sedated.  SKIN: No obvious rash, lesion, or ulcer.   LABORATORY PANEL:  CBC Recent Labs  Lab 03/13/20 1511  WBC 6.3  HGB 16.3  HCT 48.3  PLT 216    Chemistries  Recent Labs  Lab 03/13/20 1511  NA 137  K 4.4  CL 104  CO2 27  GLUCOSE 87  BUN 12  CREATININE 1.23  CALCIUM 9.4   Cardiac Enzymes No results for input(s): TROPONINI in the last 168 hours. RADIOLOGY:  CT Head Wo Contrast  Result Date: 03/13/2020 CLINICAL DATA:   History of trauma to back of head. EXAM: CT HEAD WITHOUT CONTRAST CT CERVICAL SPINE WITHOUT CONTRAST TECHNIQUE: Multidetector CT imaging of the head and cervical spine was performed following the standard protocol without intravenous contrast. Multiplanar CT image reconstructions of the cervical spine were also generated. COMPARISON:  04/22/2019 FINDINGS: CT HEAD FINDINGS Brain: No evidence of acute infarction, hemorrhage, hydrocephalus, extra-axial collection or mass lesion/mass effect. Vascular: No hyperdense vessel or unexpected calcification. Skull: Normal. Negative for fracture or focal lesion. Sinuses/Orbits: Visualized paranasal sinuses and orbits are unremarkable. Other: Small scalp hematoma along the right frontoparietal scalp near the vertex. CT CERVICAL SPINE FINDINGS Alignment: Mild straightening of normal cervical lordosis. May be positional or related to spasm. Skull base and vertebrae: No acute fracture. No primary bone lesion or focal pathologic process. Soft tissues and spinal canal: No prevertebral fluid or swelling. No visible canal hematoma. Disc levels: Mild spinal degenerative changes at C4-5, C5-6 and C6-7. Upper chest: Negative. Other: None IMPRESSION: 1. No acute intracranial abnormality. 2. Small right frontoparietal scalp hematoma near the vertex. 3. No acute fracture or subluxation of the cervical spine. Electronically Signed   By: Donzetta Kohut M.D.   On: 03/13/2020 17:31   CT Cervical Spine Wo Contrast  Result Date: 03/13/2020 CLINICAL DATA:  History of trauma to back of head. EXAM: CT HEAD WITHOUT CONTRAST CT CERVICAL SPINE WITHOUT CONTRAST TECHNIQUE: Multidetector CT imaging of the head and cervical spine was performed following the standard protocol without intravenous contrast. Multiplanar CT  image reconstructions of the cervical spine were also generated. COMPARISON:  04/22/2019 FINDINGS: CT HEAD FINDINGS Brain: No evidence of acute infarction, hemorrhage, hydrocephalus,  extra-axial collection or mass lesion/mass effect. Vascular: No hyperdense vessel or unexpected calcification. Skull: Normal. Negative for fracture or focal lesion. Sinuses/Orbits: Visualized paranasal sinuses and orbits are unremarkable. Other: Small scalp hematoma along the right frontoparietal scalp near the vertex. CT CERVICAL SPINE FINDINGS Alignment: Mild straightening of normal cervical lordosis. May be positional or related to spasm. Skull base and vertebrae: No acute fracture. No primary bone lesion or focal pathologic process. Soft tissues and spinal canal: No prevertebral fluid or swelling. No visible canal hematoma. Disc levels: Mild spinal degenerative changes at C4-5, C5-6 and C6-7. Upper chest: Negative. Other: None IMPRESSION: 1. No acute intracranial abnormality. 2. Small right frontoparietal scalp hematoma near the vertex. 3. No acute fracture or subluxation of the cervical spine. Electronically Signed   By: Zetta Bills M.D.   On: 03/13/2020 17:31   ASSESSMENT AND PLAN:  Isaac Vaughn is a 29 y.o. male with medical history significant for seizure, asthma, mild intellectual disability who presents with concerns of seizure. Patient reportedly told brother to get help when he was outside and brother saw him fall backwards  Recurrent Seizure with h/o chronic Seizure -now on IV Vimpat to 200BID per neuro -continue daily Keppra 1000 mg bid -frequent neurochecks -seizure precaution - allow po regular diet when awake -EEG pending -seen by Dr Zeylikman--recommends cont above meds and MRI if no imprvoemnt  Asthma continue Singular when able sats stable on RA  Mild intellectual disability normally conversive   DVT prophylaxis:.Lovenox Code Status: Full Family Communication: Plan discussed with mother at bedside  disposition Plan: Home with at least 2 midnight stays.  Patient still is postictal. Needs IV antiseizure meds Will discharge to home once awake and able to tolerate  PO meds. Consults called: Neurology Admission status: inpatient requiring close neuro monitoring for seizure. Marland Kitchen   TOTAL TIME TAKING CARE OF THIS PATIENT: 25 minutes.  >50% time spent on counselling and coordination of care  Note: This dictation was prepared with Dragon dictation along with smaller phrase technology. Any transcriptional errors that result from this process are unintentional.  Fritzi Mandes M.D    [facility]   CC: Primary care physician; [facility], [facility] FacultyPatient ID: Isaac Vaughn, male   DOB: [birth year], 29 y.o.   MRN: [id number]

## 2020-03-14 NOTE — Progress Notes (Addendum)
Patient laying in bed with eyes open, no distress but patient is not following any commands. Dr. Allena Katz paged to notify. Per MD her and Neurologist were at bedside and are aware. VSS will monitor more closely.   Update 1722: Patient back to normal state. Alert and oriented X4, following commands. Up to the bathroom with assistance. Asking for pain medicine for a headache, MD asked for order.

## 2020-03-14 NOTE — Progress Notes (Signed)
Pt received to room 255 from ED.  Pt oriented to room and call bell.  Pt instructed of NPO status.  See admit database and assessment, vs's.  Pt denies pain at this time or distress.

## 2020-03-14 NOTE — Progress Notes (Signed)
Pt c/o rectal pain and stated " it feels like its inside".  Pt describes a sharp pain that is intermittent and asking for pain med.  NP on call notified and aware.

## 2020-03-14 NOTE — Progress Notes (Signed)
Pt resting quietly w/ eyes closed.  Resp even and unlabored.  No observed needs at this time. 

## 2020-03-15 ENCOUNTER — Inpatient Hospital Stay: Payer: Medicaid Other

## 2020-03-15 MED ORDER — AMLODIPINE BESYLATE 5 MG PO TABS
5.0000 mg | ORAL_TABLET | Freq: Every day | ORAL | Status: DC
  Administered 2020-03-15 – 2020-03-18 (×3): 5 mg via ORAL
  Filled 2020-03-15 (×4): qty 1

## 2020-03-15 MED ORDER — LACOSAMIDE 50 MG PO TABS
200.0000 mg | ORAL_TABLET | Freq: Two times a day (BID) | ORAL | Status: DC
  Administered 2020-03-15 – 2020-03-18 (×7): 200 mg via ORAL
  Filled 2020-03-15 (×7): qty 4

## 2020-03-15 MED ORDER — LORAZEPAM 2 MG/ML IJ SOLN
1.0000 mg | INTRAMUSCULAR | Status: DC | PRN
  Administered 2020-03-15: 19:00:00 1 mg via INTRAVENOUS
  Filled 2020-03-15: qty 1

## 2020-03-15 MED ORDER — LORAZEPAM 2 MG/ML IJ SOLN: 0.5000 mg/h | INTRAVENOUS | Status: DC

## 2020-03-15 MED ORDER — LEVETIRACETAM 100 MG/ML PO SOLN
1000.0000 mg | Freq: Two times a day (BID) | ORAL | Status: DC
  Administered 2020-03-15 – 2020-03-16 (×3): 1000 mg via ORAL
  Filled 2020-03-15 (×4): qty 10

## 2020-03-15 MED ORDER — LEVETIRACETAM 500 MG PO TABS
1000.0000 mg | ORAL_TABLET | Freq: Two times a day (BID) | ORAL | Status: DC
  Filled 2020-03-15: qty 2

## 2020-03-15 MED ORDER — LORAZEPAM 2 MG/ML IJ SOLN
INTRAMUSCULAR | Status: AC
  Administered 2020-03-16: 19:00:00 1 mg via INTRAVENOUS
  Filled 2020-03-15: qty 1

## 2020-03-15 NOTE — Procedures (Signed)
ELECTROENCEPHALOGRAM REPORT   Patient: Isaac Vaughn       Room #: 255A-AA EEG No. ID: [id number] Age: 29 y.o.        Sex: male Requesting Physician: Allena Katz Report Date:  03/15/2020        Interpreting Physician: Thana Farr  History: Isaac Vaughn is an 29 y.o. male with a history of seizures  Medications:  Norvasc, Vimpat, Keppra  Conditions of Recording:  This is a 21 channel routine scalp EEG performed with bipolar and monopolar montages arranged in accordance to the international 10/20 system of electrode placement. One channel was dedicated to EKG recording.  The patient is in the awake, drowsy and asleep states.  Description:  The waking background activity consists of a low voltage, symmetrical, fairly well organized, 9 Hz alpha activity, seen from the parieto-occipital and posterior temporal regions.  Low voltage fast activity, poorly organized, is seen anteriorly and is at times superimposed on more posterior regions.  A mixture of theta and alpha rhythms are seen from the central and temporal regions. The patient drowses with slowing to irregular, low voltage theta and beta activity.   The patient goes in to a light sleep with symmetrical sleep spindles, vertex central sharp transients and irregular slow activity.   No epileptiform activity is noted.   Hyperventilation was not performed.  Intermittent photic stimulation was performed but failed to illicit any change in the tracing.     IMPRESSION: Normal electroencephalogram, awake, asleep and with activation procedures. There are no focal lateralizing or epileptiform features.   Thana Farr, MD Neurology [phone] 03/15/2020, 10:23 AM

## 2020-03-15 NOTE — Progress Notes (Signed)
[facility]  - [place] at [facility]   PATIENT NAME: Edoardo Laforte    MR#:  [id number]  DATE OF BIRTH:  June 14, 1991  SUBJECTIVE:  mother in the room patient just got back from EEG flickering his eyelids. Opens eyes however closes immediately. No active seizures noted. Did receive high dose of Keppra and Ativan.  REVIEW OF SYSTEMS:   Review of Systems  Constitutional: Negative for chills, fever and weight loss.  HENT: Negative for ear discharge, ear pain and nosebleeds.   Eyes: Negative for blurred vision, pain and discharge.  Respiratory: Positive for cough. Negative for sputum production, shortness of breath, wheezing and stridor.   Cardiovascular: Negative for chest pain, palpitations, orthopnea and PND.  Gastrointestinal: Negative for abdominal pain, diarrhea, nausea and vomiting.  Genitourinary: Negative for frequency and urgency.  Musculoskeletal: Negative for back pain and joint pain.  Neurological: Positive for weakness. Negative for sensory change, speech change and focal weakness.  Psychiatric/Behavioral: Negative for depression and hallucinations. The patient is not nervous/anxious.    Tolerating Diet:yes Tolerating PT: ambulatory  DRUG ALLERGIES:   Allergies  Allergen Reactions  . Other Other (See Comments)    Seasonal allergies: runny nose, congestion, watery eyes, sneezing    VITALS:  Blood pressure 119/78, pulse 86, temperature 97.8 F (36.6 C), temperature source Oral, resp. rate 17, height 6' (1.829 m), weight 131.5 kg, SpO2 100 %.  PHYSICAL EXAMINATION:   Physical Examlimited exam  GENERAL:  29 y.o.-year-old patient lying in the bed with no acute distress. obese EYES: Pupils equal, round, reactive to light and accommodation. No scleral icterus.   HEENT: Head atraumatic, normocephalic. Oropharynx and nasopharynx clear. No tongue bite LUNGS: Normal breath sounds bilaterally, no wheezing, rales, rhonchi. No use of accessory muscles of  respiration.  CARDIOVASCULAR: S1, S2 normal. No murmurs, rubs, or gallops.  ABDOMEN: Soft, nontender, nondistended. Bowel sounds present. No organomegaly or mass.  EXTREMITIES: No cyanosis, clubbing or edema b/l.    NEUROLOGIC: unable to assess. Patient appears postictal  PSYCHIATRIC:  patient sedated.  SKIN: No obvious rash, lesion, or ulcer.   LABORATORY PANEL:  CBC Recent Labs  Lab 03/13/20 1511  WBC 6.3  HGB 16.3  HCT 48.3  PLT 216    Chemistries  Recent Labs  Lab 03/13/20 1511  NA 137  K 4.4  CL 104  CO2 27  GLUCOSE 87  BUN 12  CREATININE 1.23  CALCIUM 9.4   Cardiac Enzymes No results for input(s): TROPONINI in the last 168 hours. RADIOLOGY:  EEG  Result Date: 03/15/2020 Thana Farr, MD     03/15/2020 10:28 AM ELECTROENCEPHALOGRAM REPORT Patient: HITESH FOUCHE       Room #: 255A-AA EEG No. ID: [id number] Age: 29 y.o.        Sex: male Requesting Physician: Allena Katz Report Date:  03/15/2020       Interpreting Physician: Thana Farr History: MIGUELANGEL KORN is an 29 y.o. male with a history of seizures Medications: Norvasc, Vimpat, Keppra Conditions of Recording:  This is a 21 channel routine scalp EEG performed with bipolar and monopolar montages arranged in accordance to the international 10/20 system of electrode placement. One channel was dedicated to EKG recording. The patient is in the awake, drowsy and asleep states. Description:  The waking background activity consists of a low voltage, symmetrical, fairly well organized, 9 Hz alpha activity, seen from the parieto-occipital and posterior temporal regions.  Low voltage fast activity, poorly organized, is seen anteriorly and is  at times superimposed on more posterior regions.  A mixture of theta and alpha rhythms are seen from the central and temporal regions. The patient drowses with slowing to irregular, low voltage theta and beta activity.  The patient goes in to a light sleep with symmetrical sleep spindles, vertex  central sharp transients and irregular slow activity.  No epileptiform activity is noted.  Hyperventilation was not performed.  Intermittent photic stimulation was performed but failed to illicit any change in the tracing.  IMPRESSION: Normal electroencephalogram, awake, asleep and with activation procedures. There are no focal lateralizing or epileptiform features. Alexis Goodell, MD Neurology [phone] 03/15/2020, 10:23 AM   CT Head Wo Contrast  Result Date: 03/13/2020 CLINICAL DATA:  History of trauma to back of head. EXAM: CT HEAD WITHOUT CONTRAST CT CERVICAL SPINE WITHOUT CONTRAST TECHNIQUE: Multidetector CT imaging of the head and cervical spine was performed following the standard protocol without intravenous contrast. Multiplanar CT image reconstructions of the cervical spine were also generated. COMPARISON:  04/22/2019 FINDINGS: CT HEAD FINDINGS Brain: No evidence of acute infarction, hemorrhage, hydrocephalus, extra-axial collection or mass lesion/mass effect. Vascular: No hyperdense vessel or unexpected calcification. Skull: Normal. Negative for fracture or focal lesion. Sinuses/Orbits: Visualized paranasal sinuses and orbits are unremarkable. Other: Small scalp hematoma along the right frontoparietal scalp near the vertex. CT CERVICAL SPINE FINDINGS Alignment: Mild straightening of normal cervical lordosis. May be positional or related to spasm. Skull base and vertebrae: No acute fracture. No primary bone lesion or focal pathologic process. Soft tissues and spinal canal: No prevertebral fluid or swelling. No visible canal hematoma. Disc levels: Mild spinal degenerative changes at C4-5, C5-6 and C6-7. Upper chest: Negative. Other: None IMPRESSION: 1. No acute intracranial abnormality. 2. Small right frontoparietal scalp hematoma near the vertex. 3. No acute fracture or subluxation of the cervical spine. Electronically Signed   By: Zetta Bills M.D.   On: 03/13/2020 17:31   CT Cervical Spine Wo  Contrast  Result Date: 03/13/2020 CLINICAL DATA:  History of trauma to back of head. EXAM: CT HEAD WITHOUT CONTRAST CT CERVICAL SPINE WITHOUT CONTRAST TECHNIQUE: Multidetector CT imaging of the head and cervical spine was performed following the standard protocol without intravenous contrast. Multiplanar CT image reconstructions of the cervical spine were also generated. COMPARISON:  04/22/2019 FINDINGS: CT HEAD FINDINGS Brain: No evidence of acute infarction, hemorrhage, hydrocephalus, extra-axial collection or mass lesion/mass effect. Vascular: No hyperdense vessel or unexpected calcification. Skull: Normal. Negative for fracture or focal lesion. Sinuses/Orbits: Visualized paranasal sinuses and orbits are unremarkable. Other: Small scalp hematoma along the right frontoparietal scalp near the vertex. CT CERVICAL SPINE FINDINGS Alignment: Mild straightening of normal cervical lordosis. May be positional or related to spasm. Skull base and vertebrae: No acute fracture. No primary bone lesion or focal pathologic process. Soft tissues and spinal canal: No prevertebral fluid or swelling. No visible canal hematoma. Disc levels: Mild spinal degenerative changes at C4-5, C5-6 and C6-7. Upper chest: Negative. Other: None IMPRESSION: 1. No acute intracranial abnormality. 2. Small right frontoparietal scalp hematoma near the vertex. 3. No acute fracture or subluxation of the cervical spine. Electronically Signed   By: Zetta Bills M.D.   On: 03/13/2020 17:31   DG Chest Port 1 View  Result Date: 03/15/2020 CLINICAL DATA:  Cough with occasional hemoptysis EXAM: PORTABLE CHEST 1 VIEW COMPARISON:  Apr 23, 2019 FINDINGS: Lungs are clear. Heart size and pulmonary vascularity are normal. No adenopathy. No bone lesions. IMPRESSION: Lungs clear.  Cardiac silhouette within normal  limits. Electronically Signed   By: Bretta Bang III M.D.   On: 03/15/2020 10:19   ASSESSMENT AND PLAN:  HARNOOR KOHLES is a 29 y.o. male  with medical history significant for seizure, asthma, mild intellectual disability who presents with concerns of seizure. Patient reportedly told brother to get help when he was outside and brother saw him fall backwards  Recurrent Seizure with h/o chronic Seizure -now on IV Vimpat to 200BID per neuro --now po  -continue daily Keppra 1000 mg bid--po keppra -now alert and awake -seizure precaution - tolerating po -EEG negative -seen by Dr Loretha Brasil Dr Thad Ranger-- keep one more day to monitor  ?cough Blood -CXR negative -no tongue bite -pt asked to show it to RN if any of this happens  Asthma continue Singular when able sats stable on RA  Mild intellectual disability normally conversive   DVT prophylaxis:.Lovenox Code Status: Full Family Communication: Plan discussed with mother at bedside  disposition Plan: Home with at least 2 midnight stays.  Neurology recommends observe one more nite anticipate discharge 4/14 Consults called: Neurology Admission status: inpatient requiring close neuro monitoring for seizure. Marland Kitchen   TOTAL TIME TAKING CARE OF THIS PATIENT: 25 minutes.  >50% time spent on counselling and coordination of care  Note: This dictation was prepared with Dragon dictation along with smaller phrase technology. Any transcriptional errors that result from this process are unintentional.  Enedina Finner M.D    [facility]   CC: Primary care physician; [facility], [facility] FacultyPatient ID: [name], male   DOB: [birth year], 29 y.o.   MRN: [id number]

## 2020-03-15 NOTE — Progress Notes (Signed)
Subjective: Patient reports some short seizures after arriving to the floor on yesterday but has otherwise done well.  Reports that his tongue is numb.  He will get this at times after his seizure or as an aura but it is usually short-lived.  This duration is unusual for him.  Reports spitting up blood as well but did not report to nursing.    Objective: Current vital signs: BP (!) 151/91 (BP Location: Right Arm)   Pulse 71   Temp 97.9 F (36.6 C) (Oral)   Resp 16   Ht 6' (1.829 m)   Wt 131.5 kg   SpO2 100%   BMI 39.33 kg/m  Vital signs in last 24 hours: Temp:  [97.6 F (36.4 C)-98.4 F (36.9 C)] 97.9 F (36.6 C) (04/13 0900) Pulse Rate:  [59-87] 71 (04/13 0900) Resp:  [16-18] 16 (04/13 0900) BP: (139-158)/(86-95) 151/91 (04/13 0900) SpO2:  [99 %-100 %] 100 % (04/13 0900) Weight:  [131.5 kg] 131.5 kg (04/13 0426)  Intake/Output from previous day: 04/12 0701 - 04/13 0700 In: 245.4 [I.V.:75.4; IV Piggyback:170] Out: 350 [Urine:350] Intake/Output this shift: Total I/O In: 360 [P.O.:360] Out: 300 [Urine:300] Nutritional status:  Diet Order            Diet Heart Room service appropriate? Yes; Fluid consistency: Thin  Diet effective now              Neurologic Exam: Mental Status: Alert, oriented, thought content appropriate.  Speech fluent without evidence of aphasia.  Able to follow 3 step commands without difficulty. Cranial Nerves: II: Visual fields grossly normal III,IV, VI: ptosis not present, extra-ocular motions intact bilaterally V,VII: smile symmetric, facial light touch sensation normal bilaterally VIII: hearing normal bilaterally IX,X: gag reflex present XI: bilateral shoulder shrug XII: midline tongue extension Motor: 5/5 throughout Sensory: Pinprick and light touch intact throughout, bilaterally   Lab Results: Basic Metabolic Panel: Recent Labs  Lab 03/13/20 1511  NA 137  K 4.4  CL 104  CO2 27  GLUCOSE 87  BUN 12  CREATININE 1.23   CALCIUM 9.4    Liver Function Tests: No results for input(s): AST, ALT, ALKPHOS, BILITOT, PROT, ALBUMIN in the last 168 hours. No results for input(s): LIPASE, AMYLASE in the last 168 hours. No results for input(s): AMMONIA in the last 168 hours.  CBC: Recent Labs  Lab 03/13/20 1511  WBC 6.3  HGB 16.3  HCT 48.3  MCV 96.4  PLT 216    Cardiac Enzymes: No results for input(s): CKTOTAL, CKMB, CKMBINDEX, TROPONINI in the last 168 hours.  Lipid Panel: No results for input(s): CHOL, TRIG, HDL, CHOLHDL, VLDL, LDLCALC in the last 168 hours.  CBG: No results for input(s): GLUCAP in the last 168 hours.  Microbiology: Results for orders placed or performed during the hospital encounter of 03/13/20  SARS CORONAVIRUS 2 (TAT 6-24 HRS) Nasopharyngeal Nasopharyngeal Swab     Status: None   Collection Time: 03/13/20  4:02 PM   Specimen: Nasopharyngeal Swab  Result Value Ref Range Status   SARS Coronavirus 2 NEGATIVE NEGATIVE Final    Comment: (NOTE) SARS-CoV-2 target nucleic acids are NOT DETECTED. The SARS-CoV-2 RNA is generally detectable in upper and lower respiratory specimens during the acute phase of infection. Negative results do not preclude SARS-CoV-2 infection, do not rule out co-infections with other pathogens, and should not be used as the sole basis for treatment or other patient management decisions. Negative results must be combined with clinical observations, patient history, and epidemiological  information. The expected result is Negative. Fact Sheet for Patients: SugarRoll.be Fact Sheet for Healthcare Providers: https://www.woods-mathews.com/ This test is not yet approved or cleared by the [place] FDA and  has been authorized for detection and/or diagnosis of SARS-CoV-2 by FDA under an Emergency Use Authorization (EUA). This EUA will remain  in effect (meaning this test can be used) for the duration of the COVID-19  declaration under Section 56 4(b)(1) of the Act, 21 U.S.C. section 360bbb-3(b)(1), unless the authorization is terminated or revoked sooner. Performed at [facility], [place] [address] [place], [place] [address]     Coagulation Studies: No results for input(s): LABPROT, INR in the last 72 hours.  Imaging: EEG  Result Date: 03/15/2020 Alexis Goodell, MD     03/15/2020 10:28 AM ELECTROENCEPHALOGRAM REPORT Patient: AARIC DOLPH       Room #: 102V-OZ EEG No. ID: [id number] Age: 29 y.o.        Sex: male Requesting Physician: Posey Pronto Report Date:  03/15/2020       Interpreting Physician: Alexis Goodell History: SHAMARR FAUCETT is an 29 y.o. male with a history of seizures Medications: Norvasc, Vimpat, Keppra Conditions of Recording:  This is a 21 channel routine scalp EEG performed with bipolar and monopolar montages arranged in accordance to the international 10/20 system of electrode placement. One channel was dedicated to EKG recording. The patient is in the awake, drowsy and asleep states. Description:  The waking background activity consists of a low voltage, symmetrical, fairly well organized, 9 Hz alpha activity, seen from the parieto-occipital and posterior temporal regions.  Low voltage fast activity, poorly organized, is seen anteriorly and is at times superimposed on more posterior regions.  A mixture of theta and alpha rhythms are seen from the central and temporal regions. The patient drowses with slowing to irregular, low voltage theta and beta activity.  The patient goes in to a light sleep with symmetrical sleep spindles, vertex central sharp transients and irregular slow activity.  No epileptiform activity is noted.  Hyperventilation was not performed.  Intermittent photic stimulation was performed but failed to illicit any change in the tracing.  IMPRESSION: Normal electroencephalogram, awake, asleep and with activation procedures. There are no focal lateralizing or epileptiform  features. Alexis Goodell, MD Neurology [phone] 03/15/2020, 10:23 AM   CT Head Wo Contrast  Result Date: 03/13/2020 CLINICAL DATA:  History of trauma to back of head. EXAM: CT HEAD WITHOUT CONTRAST CT CERVICAL SPINE WITHOUT CONTRAST TECHNIQUE: Multidetector CT imaging of the head and cervical spine was performed following the standard protocol without intravenous contrast. Multiplanar CT image reconstructions of the cervical spine were also generated. COMPARISON:  04/22/2019 FINDINGS: CT HEAD FINDINGS Brain: No evidence of acute infarction, hemorrhage, hydrocephalus, extra-axial collection or mass lesion/mass effect. Vascular: No hyperdense vessel or unexpected calcification. Skull: Normal. Negative for fracture or focal lesion. Sinuses/Orbits: Visualized paranasal sinuses and orbits are unremarkable. Other: Small scalp hematoma along the right frontoparietal scalp near the vertex. CT CERVICAL SPINE FINDINGS Alignment: Mild straightening of normal cervical lordosis. May be positional or related to spasm. Skull base and vertebrae: No acute fracture. No primary bone lesion or focal pathologic process. Soft tissues and spinal canal: No prevertebral fluid or swelling. No visible canal hematoma. Disc levels: Mild spinal degenerative changes at C4-5, C5-6 and C6-7. Upper chest: Negative. Other: None IMPRESSION: 1. No acute intracranial abnormality. 2. Small right frontoparietal scalp hematoma near the vertex. 3. No acute fracture or subluxation of the cervical  spine. Electronically Signed   By: Donzetta Kohut M.D.   On: 03/13/2020 17:31   CT Cervical Spine Wo Contrast  Result Date: 03/13/2020 CLINICAL DATA:  History of trauma to back of head. EXAM: CT HEAD WITHOUT CONTRAST CT CERVICAL SPINE WITHOUT CONTRAST TECHNIQUE: Multidetector CT imaging of the head and cervical spine was performed following the standard protocol without intravenous contrast. Multiplanar CT image reconstructions of the cervical spine were  also generated. COMPARISON:  04/22/2019 FINDINGS: CT HEAD FINDINGS Brain: No evidence of acute infarction, hemorrhage, hydrocephalus, extra-axial collection or mass lesion/mass effect. Vascular: No hyperdense vessel or unexpected calcification. Skull: Normal. Negative for fracture or focal lesion. Sinuses/Orbits: Visualized paranasal sinuses and orbits are unremarkable. Other: Small scalp hematoma along the right frontoparietal scalp near the vertex. CT CERVICAL SPINE FINDINGS Alignment: Mild straightening of normal cervical lordosis. May be positional or related to spasm. Skull base and vertebrae: No acute fracture. No primary bone lesion or focal pathologic process. Soft tissues and spinal canal: No prevertebral fluid or swelling. No visible canal hematoma. Disc levels: Mild spinal degenerative changes at C4-5, C5-6 and C6-7. Upper chest: Negative. Other: None IMPRESSION: 1. No acute intracranial abnormality. 2. Small right frontoparietal scalp hematoma near the vertex. 3. No acute fracture or subluxation of the cervical spine. Electronically Signed   By: Donzetta Kohut M.D.   On: 03/13/2020 17:31   DG Chest Port 1 View  Result Date: 03/15/2020 CLINICAL DATA:  Cough with occasional hemoptysis EXAM: PORTABLE CHEST 1 VIEW COMPARISON:  Apr 23, 2019 FINDINGS: Lungs are clear. Heart size and pulmonary vascularity are normal. No adenopathy. No bone lesions. IMPRESSION: Lungs clear.  Cardiac silhouette within normal limits. Electronically Signed   By: Bretta Bang III M.D.   On: 03/15/2020 10:19    Medications:  I have reviewed the patient's current medications. Scheduled: . amLODipine  5 mg Oral Daily  . enoxaparin (LOVENOX) injection  40 mg Subcutaneous Q24H  . famotidine  20 mg Oral Daily  . hydrocortisone  25 mg Rectal BID  . lacosamide  200 mg Oral BID  . levETIRAcetam  1,000 mg Oral BID  . montelukast  10 mg Oral Daily    Assessment/Plan: 29 year old male with a history of seizure, asthma,  mild intellectual disability who presented with breakthrough seizure activity.  Head CT personally reviewed and shows no acute changes.  Vimpat increased to 200mg  BID and patient on 1000mg  BID of Keppra as was taking prior to admission.  Patient appears to be tolerating this well.  Complains of tongue numbness.  This is a known to be associated with his seizures in the past.  Do not suspect medication side effect.  No evidence of tongue swelling or laceration noted on exam.  May be prolonged due to multiplicity of seizures noted on yesterday but will observe for continued seizure activity as well.    Recommendations: 1. Continue seizure precautions 2. Continue antiepileptic medications at current doses 3. Will continue to follow with you.     LOS: 2 days   , MD Neurology [phone] 03/15/2020  11:17 AM

## 2020-03-15 NOTE — Progress Notes (Signed)
Pt called out c/o tongue being numb. Went in to assess, pt in bed, blinking eyes rapidly, no tremors noted, did not loose bowel/bladder control, no distress noted. MD notified/ordered ativan to be given. VSS. Will continue to monitor.

## 2020-03-16 DIAGNOSIS — J45909 Unspecified asthma, uncomplicated: Secondary | ICD-10-CM

## 2020-03-16 MED ORDER — LEVETIRACETAM 100 MG/ML PO SOLN
1250.0000 mg | Freq: Two times a day (BID) | ORAL | Status: DC
  Filled 2020-03-16: qty 15

## 2020-03-16 MED ORDER — OXYCODONE HCL 5 MG PO TABS
5.0000 mg | ORAL_TABLET | Freq: Four times a day (QID) | ORAL | Status: DC | PRN
  Administered 2020-03-16: 18:00:00 5 mg via ORAL
  Filled 2020-03-16: qty 1

## 2020-03-16 MED ORDER — ACETAMINOPHEN 500 MG PO TABS
500.0000 mg | ORAL_TABLET | Freq: Three times a day (TID) | ORAL | Status: DC | PRN
  Administered 2020-03-16 (×2): 500 mg via ORAL
  Filled 2020-03-16 (×2): qty 1

## 2020-03-16 MED ORDER — SODIUM CHLORIDE 0.9 % IV SOLN
250.0000 mg | Freq: Once | INTRAVENOUS | Status: AC
  Administered 2020-03-16: 12:00:00 250 mg via INTRAVENOUS
  Filled 2020-03-16: qty 2.5

## 2020-03-16 NOTE — Progress Notes (Addendum)
Subjective: Patient and mother report a seizure last evening.  Otherwise did well overnight.    Objective: Current vital signs: BP (!) 142/96 (BP Location: Left Arm)   Pulse 77   Temp 98.3 F (36.8 C) (Oral)   Resp 18   Ht 6' (1.829 m)   Wt 131.9 kg   SpO2 99%   BMI 39.44 kg/m  Vital signs in last 24 hours: Temp:  [97.7 F (36.5 C)-98.5 F (36.9 C)] 98.3 F (36.8 C) (04/14 0801) Pulse Rate:  [71-86] 77 (04/14 0801) Resp:  [17-20] 18 (04/14 0801) BP: (119-163)/(78-96) 142/96 (04/14 0801) SpO2:  [99 %-100 %] 99 % (04/14 0801) Weight:  [131.9 kg] 131.9 kg (04/14 0404)  Intake/Output from previous day: 04/13 0701 - 04/14 0700 In: 720 [P.O.:720] Out: 300 [Urine:300] Intake/Output this shift: No intake/output data recorded. Nutritional status:  Diet Order            Diet Heart Room service appropriate? Yes; Fluid consistency: Thin  Diet effective now              Neurologic Exam: Mental Status: Alert, oriented, thought content appropriate.  Speech fluent without evidence of aphasia.  Able to follow 3 step commands without difficulty. Cranial Nerves: II: Discs flat bilaterally; Visual fields grossly normal, pupils equal, round, reactive to light and accommodation III,IV, VI: ptosis not present, extra-ocular motions intact bilaterally V,VII: smile symmetric, facial light touch sensation normal bilaterally VIII: hearing normal bilaterally IX,X: gag reflex present XI: bilateral shoulder shrug XII: midline tongue extension Motor: 5/5 throughout Sensory: Pinprick and light touch intact throughout, bilaterally  Lab Results: Basic Metabolic Panel: Recent Labs  Lab 03/13/20 1511  NA 137  K 4.4  CL 104  CO2 27  GLUCOSE 87  BUN 12  CREATININE 1.23  CALCIUM 9.4    Liver Function Tests: No results for input(s): AST, ALT, ALKPHOS, BILITOT, PROT, ALBUMIN in the last 168 hours. No results for input(s): LIPASE, AMYLASE in the last 168 hours. No results for  input(s): AMMONIA in the last 168 hours.  CBC: Recent Labs  Lab 03/13/20 1511  WBC 6.3  HGB 16.3  HCT 48.3  MCV 96.4  PLT 216    Cardiac Enzymes: No results for input(s): CKTOTAL, CKMB, CKMBINDEX, TROPONINI in the last 168 hours.  Lipid Panel: No results for input(s): CHOL, TRIG, HDL, CHOLHDL, VLDL, LDLCALC in the last 168 hours.  CBG: No results for input(s): GLUCAP in the last 168 hours.  Microbiology: Results for orders placed or performed during the hospital encounter of 03/13/20  SARS CORONAVIRUS 2 (TAT 6-24 HRS) Nasopharyngeal Nasopharyngeal Swab     Status: None   Collection Time: 03/13/20  4:02 PM   Specimen: Nasopharyngeal Swab  Result Value Ref Range Status   SARS Coronavirus 2 NEGATIVE NEGATIVE Final    Comment: (NOTE) SARS-CoV-2 target nucleic acids are NOT DETECTED. The SARS-CoV-2 RNA is generally detectable in upper and lower respiratory specimens during the acute phase of infection. Negative results do not preclude SARS-CoV-2 infection, do not rule out co-infections with other pathogens, and should not be used as the sole basis for treatment or other patient management decisions. Negative results must be combined with clinical observations, patient history, and epidemiological information. The expected result is Negative. Fact Sheet for Patients: HairSlick.no Fact Sheet for Healthcare Providers: quierodirigir.com This test is not yet approved or cleared by the [place] FDA and  has been authorized for detection and/or diagnosis of SARS-CoV-2 by FDA under an Emergency  Use Authorization (EUA). This EUA will remain  in effect (meaning this test can be used) for the duration of the COVID-19 declaration under Section 56 4(b)(1) of the Act, 21 U.S.C. section 360bbb-3(b)(1), unless the authorization is terminated or revoked sooner. Performed at [facility], [place] [address] [place],  [place] [address]     Coagulation Studies: No results for input(s): LABPROT, INR in the last 72 hours.  Imaging: EEG  Result Date: 03/15/2020 Alexis Goodell, MD     03/15/2020 10:28 AM ELECTROENCEPHALOGRAM REPORT Patient: Isaac Vaughn       Room #: [id number]-QV EEG No. ID: [id number] Age: 29 y.o.        Sex: male Requesting Physician: Posey Pronto Report Date:  03/15/2020       Interpreting Physician: Alexis Goodell History: Isaac Vaughn is an 29 y.o. male with a history of seizures Medications: Norvasc, Vimpat, Keppra Conditions of Recording:  This is a 21 channel routine scalp EEG performed with bipolar and monopolar montages arranged in accordance to the international 10/20 system of electrode placement. One channel was dedicated to EKG recording. The patient is in the awake, drowsy and asleep states. Description:  The waking background activity consists of a low voltage, symmetrical, fairly well organized, 9 Hz alpha activity, seen from the parieto-occipital and posterior temporal regions.  Low voltage fast activity, poorly organized, is seen anteriorly and is at times superimposed on more posterior regions.  A mixture of theta and alpha rhythms are seen from the central and temporal regions. The patient drowses with slowing to irregular, low voltage theta and beta activity.  The patient goes in to a light sleep with symmetrical sleep spindles, vertex central sharp transients and irregular slow activity.  No epileptiform activity is noted.  Hyperventilation was not performed.  Intermittent photic stimulation was performed but failed to illicit any change in the tracing.  IMPRESSION: Normal electroencephalogram, awake, asleep and with activation procedures. There are no focal lateralizing or epileptiform features. Alexis Goodell, MD Neurology [phone] 03/15/2020, 10:23 AM   DG Chest Port 1 View  Result Date: 03/15/2020 CLINICAL DATA:  Cough with occasional hemoptysis EXAM: PORTABLE CHEST 1 VIEW COMPARISON:  Apr 23, 2019 FINDINGS: Lungs are clear. Heart size and pulmonary vascularity are normal. No adenopathy. No bone lesions. IMPRESSION: Lungs clear.  Cardiac silhouette within normal limits. Electronically Signed   By: Lowella Grip III M.D.   On: 03/15/2020 10:19    Medications:  I have reviewed the patient's current medications. Scheduled: . amLODipine  5 mg Oral Daily  . enoxaparin (LOVENOX) injection  40 mg Subcutaneous Q24H  . famotidine  20 mg Oral Daily  . hydrocortisone  25 mg Rectal BID  . lacosamide  200 mg Oral BID  . [START ON 03/17/2020] levETIRAcetam  1,250 mg Oral BID  . montelukast  10 mg Oral Daily    Assessment/Plan: 29 year old male with a history of seizure, asthma, mild intellectual disability who presented with breakthrough seizure activity.  Head CT personally reviewed and shows no acute changes.  Vimpat increased to 200mg  BID and patient on 1000mg  BID of Keppra as was taking prior to admission.  Patient appears to be tolerating this well.  Continues to complain of tongue numbness.  This is a known to be associated with his seizures in the past.  Do not suspect medication side effect.  Had another seizure on yesterday.   Recommendations: 1. Continue seizure precautions 2. Continue Vimpat at 200mg  BID.  Will increase Keppra to 1250mg  BID.  250mg  IV dose to be administered now.   3. Due to unusual frequency of seizures will perform MRI of the brain with and without contrast 3. Will continue to follow with you.      LOS: 3 days   , MD Neurology [phone] 03/16/2020  11:43 AM

## 2020-03-16 NOTE — Progress Notes (Signed)
mother called out saying she thinks pt is having a seizure. Upon assessment,no sign of seizure- pt starring up at ceiling, alert, VSS no other distress noted- PRN ativan given, awaiting MRI. MD/NP made aware. Will monitor.

## 2020-03-16 NOTE — Progress Notes (Signed)
Pt has multiple symptoms/complaints at start of shift including general weakness, generalized pain, difficulty breathing, nausea and reports of vomiting all day.  Prn medications utilized for pain management. Pt given prn tylenol, fiorcet and oxycodone with minimal relief. Oxygen checked and lungs auscultated wnl. Pt reports breathing improved. According to the mother, who was at the bedside during the day, she denies any nausea or vomiting today. Pt complaints/symptoms inconsistent w/ behaviors. No seizure activity noted. Pt reports delusions of Jesus to NT. Pt denies any visions/delusions when asked by nurse.

## 2020-03-16 NOTE — Progress Notes (Signed)
Pt mother updated x2 via phone overnight. No new needs/concerns at this time.

## 2020-03-16 NOTE — Progress Notes (Signed)
[facility] [place] NAME: Leory Allinson    MR#:  [id number]  DATE OF BIRTH:  1991-05-31  SUBJECTIVE:  Patient was seen and examined at the bedside, stated that he had a seizure last night And in the morning time patient was complaining of headache and 9/10 Denied any chest pain or shortness of breath no palpitations.   REVIEW OF SYSTEMS:   Review of Systems  Constitutional: Negative for chills, fever and weight loss.  HENT: Negative for ear discharge, ear pain and nosebleeds.   Eyes: Negative for blurred vision, pain and discharge.  Respiratory: Negative for sputum production, shortness of breath, wheezing and stridor.   Cardiovascular: Negative for chest pain, palpitations, orthopnea and PND.  Gastrointestinal: Negative for abdominal pain, diarrhea, nausea and vomiting.  Genitourinary: Negative for frequency and urgency.  Musculoskeletal: Negative for back pain and joint pain.  Neurological: Negative for sensory change, speech change and focal weakness.  Psychiatric/Behavioral: Negative for depression and hallucinations. The patient is not nervous/anxious.    Tolerating Diet:yes Tolerating PT: ambulatory  DRUG ALLERGIES:   Allergies  Allergen Reactions  . Other Other (See Comments)    Seasonal allergies: runny nose, congestion, watery eyes, sneezing    VITALS:  Blood pressure (!) 145/114, pulse 82, temperature 98.3 F (36.8 C), resp. rate 17, height 6' (1.829 m), weight 131.9 kg, SpO2 97 %.  PHYSICAL EXAMINATION:   Physical Examlimited exam  GENERAL:  29 y.o.-year-old patient lying in the bed with no acute distress. obese EYES: Pupils equal, round, reactive to light and accommodation. No scleral icterus.   HEENT: Head atraumatic, normocephalic. Oropharynx and nasopharynx clear. No tongue bite LUNGS: Normal breath sounds bilaterally, no wheezing, rales, rhonchi. No use of accessory muscles of respiration.  CARDIOVASCULAR:  S1, S2 normal. No murmurs, rubs, or gallops.  ABDOMEN: Soft, nontender, nondistended. Bowel sounds present. No organomegaly or mass.  EXTREMITIES: No cyanosis, clubbing or edema b/l.    NEUROLOGIC: unable to assess. Patient appears postictal  PSYCHIATRIC:  patient sedated.  SKIN: No obvious rash, lesion, or ulcer.   LABORATORY PANEL:  CBC Recent Labs  Lab 03/13/20 1511  WBC 6.3  HGB 16.3  HCT 48.3  PLT 216    Chemistries  Recent Labs  Lab 03/13/20 1511  NA 137  K 4.4  CL 104  CO2 27  GLUCOSE 87  BUN 12  CREATININE 1.23  CALCIUM 9.4   Cardiac Enzymes No results for input(s): TROPONINI in the last 168 hours. RADIOLOGY:  EEG  Result Date: 03/15/2020 Alexis Goodell, MD     03/15/2020 10:28 AM ELECTROENCEPHALOGRAM REPORT Patient: NENG ALBEE       Room #: 128N-OM EEG No. ID: [id number] Age: 29 y.o.        Sex: male Requesting Physician: Posey Pronto Report Date:  03/15/2020       Interpreting Physician: Alexis Goodell History: SAKIB NOGUEZ is an 29 y.o. male with a history of seizures Medications: Norvasc, Vimpat, Keppra Conditions of Recording:  This is a 21 channel routine scalp EEG performed with bipolar and monopolar montages arranged in accordance to the international 10/20 system of electrode placement. One channel was dedicated to EKG recording. The patient is in the awake, drowsy and asleep states. Description:  The waking background activity consists of a low voltage, symmetrical, fairly well organized, 9 Hz alpha activity, seen from the parieto-occipital and posterior temporal regions.  Low voltage fast activity, poorly organized, is seen anteriorly and  is at times superimposed on more posterior regions.  A mixture of theta and alpha rhythms are seen from the central and temporal regions. The patient drowses with slowing to irregular, low voltage theta and beta activity.  The patient goes in to a light sleep with symmetrical sleep spindles, vertex central sharp transients and  irregular slow activity.  No epileptiform activity is noted.  Hyperventilation was not performed.  Intermittent photic stimulation was performed but failed to illicit any change in the tracing.  IMPRESSION: Normal electroencephalogram, awake, asleep and with activation procedures. There are no focal lateralizing or epileptiform features. Thana Farr, MD Neurology [phone] 03/15/2020, 10:23 AM   DG Chest Port 1 View  Result Date: 03/15/2020 CLINICAL DATA:  Cough with occasional hemoptysis EXAM: PORTABLE CHEST 1 VIEW COMPARISON:  Apr 23, 2019 FINDINGS: Lungs are clear. Heart size and pulmonary vascularity are normal. No adenopathy. No bone lesions. IMPRESSION: Lungs clear.  Cardiac silhouette within normal limits. Electronically Signed   By: Bretta Bang III M.D.   On: 03/15/2020 10:19   ASSESSMENT AND PLAN:  TRAVONTAE FREIBERGER is a 29 y.o. male with medical history significant for seizure, asthma, mild intellectual disability who presents with concerns of seizure. Patient reportedly told brother to get help when he was outside and brother saw him fall backwards  Recurrent Seizure with h/o chronic Seizure -now on IV Vimpat to 200BID per neuro --now po  -4/14 increased Keppra 1250 twice daily  -now alert and awake -seizure precaution - tolerating po -EEG negative -seen by Dr Loretha Brasil Dr Thad Ranger-- keep one more day to monitor Due to unusual frequency of seizures, neurology recommended MRI brain with and without contrast    ?cough Blood -CXR negative -no tongue bite -pt asked to show it to RN if any of this happens  Asthma continue Singular when able sats stable on RA  Mild intellectual disability normally conversive    DVT prophylaxis:.Lovenox Code Status: Full Family Communication: Plan discussed with mother at bedside  disposition Plan: Home with at least 2 midnight stays.  Neurology recommends observe one more nite anticipate discharge 4/15 if remains  seizure-free  Consults called: Neurology Admission status: inpatient requiring close neuro monitoring for seizure. Marland Kitchen   TOTAL TIME TAKING CARE OF THIS PATIENT: 25 minutes.  >50% time spent on counselling and coordination of care  Note: This dictation was prepared with Dragon dictation along with smaller phrase technology. Any transcriptional errors that result from this process are unintentional.  Gillis Santa M.D    [facility]   CC: Primary care physician; Physicians, [facility] FacultyPatient ID: [name], male   DOB: [birth year], 29 y.o.   MRN: [id number]

## 2020-03-16 NOTE — Progress Notes (Signed)
Pt c/o 9 out of 10 headache pain, PRN med given with no relief. MD notified and new PRN med orders given. Will continue to monitor

## 2020-03-17 ENCOUNTER — Inpatient Hospital Stay: Payer: Medicaid Other

## 2020-03-17 LAB — BASIC METABOLIC PANEL
Anion gap: 6 (ref 5–15)
BUN: 14 mg/dL (ref 6–20)
CO2: 27 mmol/L (ref 22–32)
Calcium: 9.1 mg/dL (ref 8.9–10.3)
Chloride: 106 mmol/L (ref 98–111)
Creatinine, Ser: 1.09 mg/dL (ref 0.61–1.24)
GFR calc Af Amer: >60 mL/min (ref >60)
GFR calc non Af Amer: >60 mL/min (ref >60)
Glucose, Bld: 91 mg/dL (ref 70–99)
Potassium: 3.7 mmol/L (ref 3.5–5.1)
Sodium: 139 mmol/L (ref 135–145)

## 2020-03-17 LAB — VITAMIN B12: Vitamin B-12: 358 pg/mL (ref 180–914)

## 2020-03-17 LAB — MAGNESIUM: Magnesium: 2.1 mg/dL (ref 1.7–2.4)

## 2020-03-17 MED ORDER — LEVETIRACETAM 100 MG/ML PO SOLN
1250.0000 mg | Freq: Two times a day (BID) | ORAL | Status: DC
  Administered 2020-03-17 – 2020-03-18 (×3): 1250 mg via ORAL
  Filled 2020-03-17: qty 15
  Filled 2020-03-17 (×3): qty 12.5

## 2020-03-17 MED ORDER — GADOBUTROL 1 MMOL/ML IV SOLN
10.0000 mL | Freq: Once | INTRAVENOUS | Status: AC | PRN
  Administered 2020-03-17: 10 mL via INTRAVENOUS

## 2020-03-17 NOTE — Progress Notes (Signed)
Subjective: Patient with another episode of seizure noted by mother on yesterday.  At this point all events appear to happen when mother is leaving.    Objective: Current vital signs: BP 112/77 (BP Location: Left Arm)   Pulse 69   Temp 97.8 F (36.6 C) (Oral)   Resp 18   Ht 6' (1.829 m)   Wt 131.1 kg   SpO2 100%   BMI 39.20 kg/m  Vital signs in last 24 hours: Temp:  [97.8 F (36.6 C)-98.7 F (37.1 C)] 97.8 F (36.6 C) (04/15 0421) Pulse Rate:  [61-96] 69 (04/15 0754) Resp:  [17-18] 18 (04/15 0754) BP: (112-162)/(77-114) 112/77 (04/15 0754) SpO2:  [97 %-100 %] 100 % (04/15 0754) Weight:  [131.1 kg] 131.1 kg (04/15 0421)  Intake/Output from previous day: No intake/output data recorded. Intake/Output this shift: No intake/output data recorded. Nutritional status:  Diet Order            Diet Heart Room service appropriate? Yes; Fluid consistency: Thin  Diet effective now              Neurologic Exam: Mental Status: Alert, oriented, thought content appropriate.  Speech fluent without evidence of aphasia.  Able to follow 3 step commands without difficulty. Cranial Nerves: II: Discs flat bilaterally; Visual fields grossly normal, pupils equal, round, reactive to light and accommodation III,IV, VI: ptosis not present, extra-ocular motions intact bilaterally V,VII: smile symmetric, facial light touch sensation normal bilaterally VIII: hearing normal bilaterally IX,X: gag reflex present XI: bilateral shoulder shrug XII: midline tongue extension Motor: 5/5 throughout Sensory: Pinprick and light touch intact throughout, bilaterally   Lab Results: Basic Metabolic Panel: Recent Labs  Lab 03/13/20 1511 03/17/20 0528  NA 137 139  K 4.4 3.7  CL 104 106  CO2 27 27  GLUCOSE 87 91  BUN 12 14  CREATININE 1.23 1.09  CALCIUM 9.4 9.1  MG  --  2.1    Liver Function Tests: No results for input(s): AST, ALT, ALKPHOS, BILITOT, PROT, ALBUMIN in the last 168 hours. No  results for input(s): LIPASE, AMYLASE in the last 168 hours. No results for input(s): AMMONIA in the last 168 hours.  CBC: Recent Labs  Lab 03/13/20 1511  WBC 6.3  HGB 16.3  HCT 48.3  MCV 96.4  PLT 216    Cardiac Enzymes: No results for input(s): CKTOTAL, CKMB, CKMBINDEX, TROPONINI in the last 168 hours.  Lipid Panel: No results for input(s): CHOL, TRIG, HDL, CHOLHDL, VLDL, LDLCALC in the last 168 hours.  CBG: No results for input(s): GLUCAP in the last 168 hours.  Microbiology: Results for orders placed or performed during the hospital encounter of 03/13/20  SARS CORONAVIRUS 2 (TAT 6-24 HRS) Nasopharyngeal Nasopharyngeal Swab     Status: None   Collection Time: 03/13/20  4:02 PM   Specimen: Nasopharyngeal Swab  Result Value Ref Range Status   SARS Coronavirus 2 NEGATIVE NEGATIVE Final    Comment: (NOTE) SARS-CoV-2 target nucleic acids are NOT DETECTED. The SARS-CoV-2 RNA is generally detectable in upper and lower respiratory specimens during the acute phase of infection. Negative results do not preclude SARS-CoV-2 infection, do not rule out co-infections with other pathogens, and should not be used as the sole basis for treatment or other patient management decisions. Negative results must be combined with clinical observations, patient history, and epidemiological information. The expected result is Negative. Fact Sheet for Patients: HairSlick.no Fact Sheet for Healthcare Providers: quierodirigir.com This test is not yet approved or cleared by  the [employer] and  has been authorized for detection and/or diagnosis of SARS-CoV-2 by FDA under an Emergency Use Authorization (EUA). This EUA will remain  in effect (meaning this test can be used) for the duration of the COVID-19 declaration under Section 56 4(b)(1) of the Act, 21 U.S.C. section 360bbb-3(b)(1), unless the authorization is terminated or revoked  sooner. Performed at [facility], [address] [place], [place] [address]     Coagulation Studies: No results for input(s): LABPROT, INR in the last 72 hours.  Imaging: EEG  Result Date: 03/15/2020 Thana Farr, MD     03/15/2020 10:28 AM ELECTROENCEPHALOGRAM REPORT Patient: Isaac Vaughn       Room #: 255A-AA EEG No. ID: [id number] Age: 29 y.o.        Sex: male Requesting Physician: Allena Katz Report Date:  03/15/2020       Interpreting Physician: Thana Farr History: Isaac Vaughn is an 29 y.o. male with a history of seizures Medications: Norvasc, Vimpat, Keppra Conditions of Recording:  This is a 21 channel routine scalp EEG performed with bipolar and monopolar montages arranged in accordance to the international 10/20 system of electrode placement. One channel was dedicated to EKG recording. The patient is in the awake, drowsy and asleep states. Description:  The waking background activity consists of a low voltage, symmetrical, fairly well organized, 9 Hz alpha activity, seen from the parieto-occipital and posterior temporal regions.  Low voltage fast activity, poorly organized, is seen anteriorly and is at times superimposed on more posterior regions.  A mixture of theta and alpha rhythms are seen from the central and temporal regions. The patient drowses with slowing to irregular, low voltage theta and beta activity.  The patient goes in to a light sleep with symmetrical sleep spindles, vertex central sharp transients and irregular slow activity.  No epileptiform activity is noted.  Hyperventilation was not performed.  Intermittent photic stimulation was performed but failed to illicit any change in the tracing.  IMPRESSION: Normal electroencephalogram, awake, asleep and with activation procedures. There are no focal lateralizing or epileptiform features. Thana Farr, MD Neurology [phone] 03/15/2020, 10:23 AM   MR BRAIN W WO CONTRAST  Result Date: 03/17/2020 CLINICAL DATA:   Breakthrough seizure. EXAM: MRI HEAD WITHOUT AND WITH CONTRAST TECHNIQUE: Multiplanar, multiecho pulse sequences of the brain and surrounding structures were obtained without and with intravenous contrast. CONTRAST:  41mL GADAVIST GADOBUTROL 1 MMOL/ML IV SOLN COMPARISON:  Brain MRI 07/05/2012 FINDINGS: BRAIN: No acute infarct, acute hemorrhage or extra-axial collection. Normal white matter signal for age. Normal volume of brain parenchyma and CSF spaces. Midline structures are normal. The hippocampi are normal and symmetric in size and signal. The hypothalamus and mamillary bodies are normal. There is no cortical ectopia or dysplasia. VASCULAR: Major flow voids are preserved. Susceptibility-sensitive sequences show no chronic microhemorrhage or superficial siderosis. SKULL AND UPPER CERVICAL SPINE: Normal calvarium and skull base. Visualized upper cervical spine and soft tissues are normal. SINUSES/ORBITS: No paranasal sinus fluid levels or advanced mucosal thickening. No mastoid or middle ear effusion. Normal orbits. IMPRESSION: Normal brain MRI. Electronically Signed   By: Deatra Robinson M.D.   On: 03/17/2020 03:03    Medications:  I have reviewed the patient's current medications. Scheduled: . amLODipine  5 mg Oral Daily  . enoxaparin (LOVENOX) injection  40 mg Subcutaneous Q24H  . famotidine  20 mg Oral Daily  . hydrocortisone  25 mg Rectal BID  . lacosamide  200 mg  Oral BID  . levETIRAcetam  1,250 mg Oral BID  . montelukast  10 mg Oral Daily    Assessment/Plan: 29 year old male with a history of seizure, asthma, mild intellectual disability who presented with breakthrough seizure activity.  Head CT personally reviewed and shows no acute changes.  Vimpat increased to 200mg  BID and Keppra increased to 1250mg  BID.  Seizures now noted when mother leaving.  Concern is for component of anxiety.  Mother is with patient all of the time at home.  MRI of the brain personally reviewed and is normal.       Recommendations: 1. Continue seizure precautions 2. Continue antiepileptic medications at current doses.  With recent increase as of yesterday will not adjust doses today.   3. Will continue to follow with you.     LOS: 4 days   Alexis Goodell, MD Neurology [phone] 03/17/2020  10:13 AM

## 2020-03-17 NOTE — Progress Notes (Signed)
[facility] [place] NAME: Isaac Vaughn    MR#:  [id number]  DATE OF BIRTH:  1991/01/24  SUBJECTIVE:  Patient was seen and examined at the bedside.  As per RN patient started having seizures yesterday evening after patient's mother left.  It seems that patient was looking upwards in the ceiling, no true seizures.  Patient may be having pseudoseizures and also having delusions. In the morning patient was still complaining of headache sometimes in the back of the head, it is improved since yesterday. Denied any chest pain or shortness of breath no palpitations.   REVIEW OF SYSTEMS:   Review of Systems  Constitutional: Negative for chills, fever and weight loss.  HENT: Negative for ear discharge, ear pain and nosebleeds.   Eyes: Negative for blurred vision, pain and discharge.  Respiratory: Negative for sputum production, shortness of breath, wheezing and stridor.   Cardiovascular: Negative for chest pain, palpitations, orthopnea and PND.  Gastrointestinal: Negative for abdominal pain, diarrhea, nausea and vomiting.  Genitourinary: Negative for frequency and urgency.  Musculoskeletal: Negative for back pain and joint pain.  Neurological: Negative for sensory change, speech change and focal weakness.  Psychiatric/Behavioral: Negative for depression and hallucinations. The patient is not nervous/anxious.    Tolerating Diet:yes Tolerating PT: ambulatory  DRUG ALLERGIES:   Allergies  Allergen Reactions  . Other Other (See Comments)    Seasonal allergies: runny nose, congestion, watery eyes, sneezing    VITALS:  Blood pressure (!) 146/99, pulse 98, temperature 98.4 F (36.9 C), resp. rate 18, height 6' (1.829 m), weight 131.1 kg, SpO2 97 %.  PHYSICAL EXAMINATION:   Physical Examlimited exam  GENERAL:  29 y.o.-year-old patient lying in the bed with no acute distress. obese EYES: Pupils equal, round, reactive to light and accommodation.  No scleral icterus.   HEENT: Head atraumatic, normocephalic. Oropharynx and nasopharynx clear. No tongue bite LUNGS: Normal breath sounds bilaterally, no wheezing, rales, rhonchi. No use of accessory muscles of respiration.  CARDIOVASCULAR: S1, S2 normal. No murmurs, rubs, or gallops.  ABDOMEN: Soft, nontender, nondistended. Bowel sounds present. No organomegaly or mass.  EXTREMITIES: No cyanosis, clubbing or edema b/l.    NEUROLOGIC: unable to assess. Patient appears postictal  PSYCHIATRIC:  patient sedated.  SKIN: No obvious rash, lesion, or ulcer.   LABORATORY PANEL:  CBC Recent Labs  Lab 03/13/20 1511  WBC 6.3  HGB 16.3  HCT 48.3  PLT 216    Chemistries  Recent Labs  Lab 03/17/20 0528  NA 139  K 3.7  CL 106  CO2 27  GLUCOSE 91  BUN 14  CREATININE 1.09  CALCIUM 9.1  MG 2.1   Cardiac Enzymes No results for input(s): TROPONINI in the last 168 hours. RADIOLOGY:  MR BRAIN W WO CONTRAST  Result Date: 03/17/2020 CLINICAL DATA:  Breakthrough seizure. EXAM: MRI HEAD WITHOUT AND WITH CONTRAST TECHNIQUE: Multiplanar, multiecho pulse sequences of the brain and surrounding structures were obtained without and with intravenous contrast. CONTRAST:  18mL GADAVIST GADOBUTROL 1 MMOL/ML IV SOLN COMPARISON:  Brain MRI 07/05/2012 FINDINGS: BRAIN: No acute infarct, acute hemorrhage or extra-axial collection. Normal white matter signal for age. Normal volume of brain parenchyma and CSF spaces. Midline structures are normal. The hippocampi are normal and symmetric in size and signal. The hypothalamus and mamillary bodies are normal. There is no cortical ectopia or dysplasia. VASCULAR: Major flow voids are preserved. Susceptibility-sensitive sequences show no chronic microhemorrhage or superficial siderosis. SKULL AND UPPER  CERVICAL SPINE: Normal calvarium and skull base. Visualized upper cervical spine and soft tissues are normal. SINUSES/ORBITS: No paranasal sinus fluid levels or advanced  mucosal thickening. No mastoid or middle ear effusion. Normal orbits. IMPRESSION: Normal brain MRI. Electronically Signed   By: Deatra Robinson M.D.   On: 03/17/2020 03:03   ASSESSMENT AND PLAN:  SIPRIANO FENDLEY is a 29 y.o. male with medical history significant for seizure, asthma, mild intellectual disability who presents with concerns of seizure. Patient reportedly told brother to get help when he was outside and brother saw him fall backwards  Recurrent Seizure with h/o chronic Seizure -now on IV Vimpat to 200BID per neuro --now po  -4/14 increased Keppra 1250 twice daily  -now alert and awake -seizure precaution - tolerating po -EEG negative -seen by Dr Loretha Brasil Dr Thad Ranger-- keep one more day to monitor Due to unusual frequency of seizures, neurology recommended MRI brain with and without contrast which was normal, without any acute findings. 4/15 psych consulted for delusions and somatic symptoms.  Psych recommended no intervention and recommended to discharge patient as possible to reduce events and stress.   ?cough Blood -CXR negative -no tongue bite -pt asked to show it to RN if any of this happens  Asthma continue Singular when able sats stable on RA  Mild intellectual disability normally conversive     DVT prophylaxis:.Lovenox Code Status: Full Family Communication: Plan discussed with mother at bedside  disposition Plan: Home with at least 2 midnight stays.  Neurology recommends observe one more night, anticipate discharge 4/16    Consults called: Neurology Admission status: inpatient requiring close neuro monitoring for seizure. Marland Kitchen   TOTAL TIME TAKING CARE OF THIS PATIENT: 25 minutes.  >50% time spent on counselling and coordination of care  Note: This dictation was prepared with Dragon dictation along with smaller phrase technology. Any transcriptional errors that result from this process are unintentional.  Gillis Santa M.D    [facility]    CC: Primary care physician; Physicians, [facility] FacultyPatient ID: Isaac Vaughn, male   DOB: 07/11/1991, 29 y.o.   MRN: [id number]

## 2020-03-17 NOTE — Consult Note (Signed)
[facility] Face-to-Face Psychiatry Consult   Reason for Consult:  Investigate complaints including seizures Referring Physician:  Floor MD Patient Identification: Isaac Vaughn MRN:  [id number] Principal Diagnosis: Seizure (HCC) Diagnosis:  Principal Problem:   Seizure (HCC) Active Problems:   Asthma   Mild intellectual disabilities   Chest pain   Total Time spent with patient: 15 minutes  Subjective:   Isaac Vaughn is a 29 y.o. male patient admitted with multiple somatic complaints discussed below  On my assessment with his mother he has similar vague complaints of tension in his throat. Overall he is pleasant and cooperative. He admits that the hospital is uncomfortable and is looking forward to discharge. The symptoms appear to be worse when his mother leaves. I am assuming they are anxiety-based and will resolve once he is discharged.   HPI: per Eval team  Pt has multiple symptoms/complaints at start of shift including general weakness, generalized pain, difficulty breathing, nausea and reports of vomiting all day.  Prn medications utilized for pain management. Pt given prn tylenol, fiorcet and oxycodone with minimal relief. Oxygen checked and lungs auscultated wnl. Pt reports breathing improved. According to the mother, who was at the bedside during the day, she denies any nausea or vomiting today. Pt complaints/symptoms inconsistent w/ behaviors. No seizure activity noted. Pt reports delusions of Jesus to NT. Pt denies any visions/delusions when asked by nurse.   Past Psychiatric History: mild ID  Risk to Self:   Risk to Others:   Prior Inpatient Therapy:   Prior Outpatient Therapy:    Past Medical History:  Past Medical History:  Diagnosis Date  . Asthma   . Epilepsy (HCC)   . Seizures (HCC)    History reviewed. No pertinent surgical history. Family History:  Family History  Problem Relation Age of Onset  . Hypertension Mother   . Asthma Mother    Family Psychiatric   History: Social History:  Social History   Substance and Sexual Activity  Alcohol Use No  . Alcohol/week: 0.0 standard drinks     Social History   Substance and Sexual Activity  Drug Use No    Social History   Socioeconomic History  . Marital status: Single    Spouse name: Not on file  . Number of children: Not on file  . Years of education: Not on file  . Highest education level: Not on file  Occupational History  . Not on file  Tobacco Use  . Smoking status: Never Smoker  . Smokeless tobacco: Never Used  Substance and Sexual Activity  . Alcohol use: No    Alcohol/week: 0.0 standard drinks  . Drug use: No  . Sexual activity: Not on file  Other Topics Concern  . Not on file  Social History Narrative  . Not on file   Social Determinants of Health   Financial Resource Strain:   . Difficulty of Paying Living Expenses:   Food Insecurity:   . Worried About Programme researcher, broadcasting/film/video in the Last Year:   . Barista in the Last Year:   Transportation Needs:   . Freight forwarder (Medical):   Marland Kitchen Lack of Transportation (Non-Medical):   Physical Activity:   . Days of Exercise per Week:   . Minutes of Exercise per Session:   Stress:   . Feeling of Stress :   Social Connections:   . Frequency of Communication with Friends and Family:   . Frequency of Social Gatherings with Friends  and Family:   . Attends Religious Services:   . Active Member of Clubs or Organizations:   . Attends Archivist Meetings:   Marland Kitchen Marital Status:    Additional Social History:    Allergies:   Allergies  Allergen Reactions  . Other Other (See Comments)    Seasonal allergies: runny nose, congestion, watery eyes, sneezing    Labs:  Results for orders placed or performed during the hospital encounter of 03/13/20 (from the past 48 hour(s))  Vitamin B12     Status: None   Collection Time: 03/17/20  5:28 AM  Result Value Ref Range   Vitamin B-12 358 180 - 914 pg/mL     Comment: (NOTE) This assay is not validated for testing neonatal or myeloproliferative syndrome specimens for Vitamin B12 levels. Performed at [facility], [place] [address], [place], [place] [address]   Basic metabolic panel     Status: None   Collection Time: 03/17/20  5:28 AM  Result Value Ref Range   Sodium 139 135 - 145 mmol/L   Potassium 3.7 3.5 - 5.1 mmol/L   Chloride 106 98 - 111 mmol/L   CO2 27 22 - 32 mmol/L   Glucose, Bld 91 70 - 99 mg/dL    Comment: Glucose reference range applies only to samples taken after fasting for at least 8 hours.   BUN 14 6 - 20 mg/dL   Creatinine, Ser 1.09 0.61 - 1.24 mg/dL   Calcium 9.1 8.9 - 10.3 mg/dL   GFR calc non Af Amer >60 >60 mL/min   GFR calc Af Amer >60 >60 mL/min   Anion gap 6 5 - 15    Comment: Performed at [facility], [address] [place], [place] [address]  Magnesium     Status: None   Collection Time: 03/17/20  5:28 AM  Result Value Ref Range   Magnesium 2.1 1.7 - 2.4 mg/dL    Comment: Performed at [facility], [address], [place], [place] [address]    Current Facility-Administered Medications  Medication Dose Route Frequency Provider Last Rate Last Admin  . acetaminophen (TYLENOL) tablet 500 mg  500 mg Oral Q8H PRN Val Riles, MD   500 mg at 03/16/20 2058  . albuterol (PROVENTIL) (2.5 MG/3ML) 0.083% nebulizer solution 2.5 mg  2.5 mg Inhalation Q6H PRN Tu, Ching T, DO      . amLODipine (NORVASC) tablet 5 mg  5 mg Oral Daily Fritzi Mandes, MD   Stopped at 03/17/20 [phone]  . butalbital-acetaminophen-caffeine (FIORICET) 50-325-40 MG per tablet 2 tablet  2 tablet Oral Q6H PRN Sharion Settler, NP   2 tablet at 03/16/20 0913  . enoxaparin (LOVENOX) injection 40 mg  40 mg Subcutaneous Q24H Tu, Ching T, DO   40 mg at 03/16/20 2051  . famotidine (PEPCID) tablet 20 mg  20 mg Oral Daily Tu, Ching T, DO   20 mg at 03/17/20 0858  . hydrocortisone (ANUSOL-HC) suppository 25 mg  25 mg Rectal BID Sharion Settler, NP   25 mg at 03/14/20 0332  . lacosamide (VIMPAT) tablet 200 mg  200 mg Oral BID Fritzi Mandes, MD   200 mg at 03/17/20 0859  . levETIRAcetam (KEPPRA) 100 MG/ML solution 1,250 mg  1,250 mg Oral BID Alexis Goodell, MD   1,250 mg at 03/17/20 1213  . LORazepam (ATIVAN) injection 1 mg  1 mg Intravenous Q4H PRN Fritzi Mandes, MD   1 mg at 03/16/20 1905  . montelukast (  SINGULAIR) tablet 10 mg  10 mg Oral Daily Tu, Ching T, DO   10 mg at 03/17/20 0859  . oxyCODONE (Oxy IR/ROXICODONE) immediate release tablet 5 mg  5 mg Oral Q6H PRN Gillis Santa, MD   5 mg at 03/16/20 1753     Blood pressure (!) 146/99, pulse 98, temperature 98.4 F (36.9 C), resp. rate 18, height 6' (1.829 m), weight 131.1 kg, SpO2 97 %.Body mass index is 39.2 kg/m.  General Appearance: Well Groomed  Eye Contact:  Good  Speech:  Clear and Coherent  Volume:  Normal  Mood:  Euthymic  Affect:  Congruent  Thought Process:  Goal Directed  Orientation:  Full (Time, Place, and Person)  Thought Content:  WDL  Suicidal Thoughts:  No  Homicidal Thoughts:  No  Memory:  Immediate;   Good  Judgement:  Good  Insight:  Fair  Psychomotor Activity:  Normal  Concentration:  Concentration: Good  Recall:  Good  Fund of Knowledge:  Good  Language:  Good  Akathisia:  No  Handed:  Ambidextrous  AIMS (if indicated):     Assets:  Therapist, sports  ADL's:  Intact  Cognition:  Impaired,  Mild  Sleep:      There may be an element of pseudoseizures to his behavior likely brought on by the stress of being disconnected from his mom and in the hospital. His other somatic complaints follow the same patterns. I do not see evidence of psychosis. These two may be misperceptions of minimal meaning.  Treatment Plan Summary: No need for intervention at this time.  Disposition: No evidence of imminent risk to self or others at present.   Patient does not meet criteria for psychiatric inpatient  admission.   I would discharge him when he is medically stable. His mother strongly supports this as does he. Discharge will likely resolve the above concerns.  Cindy Hazy, MD 03/17/2020 4:19 PM

## 2020-03-18 LAB — LACOSAMIDE: Lacosamide: 4 ug/mL — ABNORMAL LOW (ref 5.0–10.0)

## 2020-03-18 LAB — LEVETIRACETAM LEVEL: Levetiracetam Lvl: 15.8 ug/mL (ref 10.0–40.0)

## 2020-03-18 MED ORDER — VIMPAT 50 MG PO TABS: 200.0000 mg | ORAL_TABLET | Freq: Two times a day (BID) | ORAL | 0 refills | Status: AC

## 2020-03-18 MED ORDER — AMLODIPINE BESYLATE 5 MG PO TABS: 5.0000 mg | ORAL_TABLET | Freq: Every day | ORAL | 0 refills | Status: AC

## 2020-03-18 MED ORDER — LEVETIRACETAM 100 MG/ML PO SOLN: 1250.0000 mg | Freq: Two times a day (BID) | ORAL | 0 refills | Status: DC

## 2020-03-18 NOTE — Progress Notes (Signed)
Subjective: Patient did well overnight.  No further seizure or seizure like activity noted.  Patient reports feeling at baseline today.    Objective: Current vital signs: BP (!) 145/90 (BP Location: Left Arm)   Pulse 81   Temp 97.7 F (36.5 C)   Resp 18   Ht 6' (1.829 m)   Wt 131.5 kg   SpO2 100%   BMI 39.32 kg/m  Vital signs in last 24 hours: Temp:  [97.7 F (36.5 C)-98.4 F (36.9 C)] 97.7 F (36.5 C) (04/16 0830) Pulse Rate:  [56-98] 81 (04/16 0830) Resp:  [15-24] 18 (04/16 0830) BP: (116-146)/(66-99) 145/90 (04/16 0830) SpO2:  [97 %-100 %] 100 % (04/16 0830) Weight:  [131.5 kg] 131.5 kg (04/16 0518)  Intake/Output from previous day: 04/15 0701 - 04/16 0700 In: 1020 [P.O.:1020] Out: 0  Intake/Output this shift: No intake/output data recorded. Nutritional status:  Diet Order            Diet Heart Room service appropriate? Yes; Fluid consistency: Thin  Diet effective now              Neurologic Exam: Mental Status: Alert, oriented, thought content appropriate.  Speech fluent without evidence of aphasia.  Able to follow 3 step commands without difficulty. Cranial Nerves: II: Discs flat bilaterally; Visual fields grossly normal, pupils equal, round, reactive to light and accommodation III,IV, VI: ptosis not present, extra-ocular motions intact bilaterally V,VII: smile symmetric, facial light touch sensation normal bilaterally VIII: hearing normal bilaterally IX,X: gag reflex present XI: bilateral shoulder shrug XII: midline tongue extension Motor: 5/5 throughout Sensory: Pinprick and light touch intact throughout, bilaterally  Lab Results: Basic Metabolic Panel: Recent Labs  Lab 03/13/20 1511 03/17/20 0528  NA 137 139  K 4.4 3.7  CL 104 106  CO2 27 27  GLUCOSE 87 91  BUN 12 14  CREATININE 1.23 1.09  CALCIUM 9.4 9.1  MG  --  2.1    Liver Function Tests: No results for input(s): AST, ALT, ALKPHOS, BILITOT, PROT, ALBUMIN in the last 168 hours. No  results for input(s): LIPASE, AMYLASE in the last 168 hours. No results for input(s): AMMONIA in the last 168 hours.  CBC: Recent Labs  Lab 03/13/20 1511  WBC 6.3  HGB 16.3  HCT 48.3  MCV 96.4  PLT 216    Cardiac Enzymes: No results for input(s): CKTOTAL, CKMB, CKMBINDEX, TROPONINI in the last 168 hours.  Lipid Panel: No results for input(s): CHOL, TRIG, HDL, CHOLHDL, VLDL, LDLCALC in the last 168 hours.  CBG: No results for input(s): GLUCAP in the last 168 hours.  Microbiology: Results for orders placed or performed during the hospital encounter of 03/13/20  SARS CORONAVIRUS 2 (TAT 6-24 HRS) Nasopharyngeal Nasopharyngeal Swab     Status: None   Collection Time: 03/13/20  4:02 PM   Specimen: Nasopharyngeal Swab  Result Value Ref Range Status   SARS Coronavirus 2 NEGATIVE NEGATIVE Final    Comment: (NOTE) SARS-CoV-2 target nucleic acids are NOT DETECTED. The SARS-CoV-2 RNA is generally detectable in upper and lower respiratory specimens during the acute phase of infection. Negative results do not preclude SARS-CoV-2 infection, do not rule out co-infections with other pathogens, and should not be used as the sole basis for treatment or other patient management decisions. Negative results must be combined with clinical observations, patient history, and epidemiological information. The expected result is Negative. Fact Sheet for Patients: HairSlick.no Fact Sheet for Healthcare Providers: quierodirigir.com This test is not yet approved or  cleared by the [place] and  has been authorized for detection and/or diagnosis of SARS-CoV-2 by FDA under an Emergency Use Authorization (EUA). This EUA will remain  in effect (meaning this test can be used) for the duration of the COVID-19 declaration under Section 56 4(b)(1) of the Act, 21 U.S.C. section 360bbb-3(b)(1), unless the authorization is terminated or revoked  sooner. Performed at [facility], [place] [address], [place],  [address]     Coagulation Studies: No results for input(s): LABPROT, INR in the last 72 hours.  Imaging: MR BRAIN W WO CONTRAST  Result Date: 03/17/2020 CLINICAL DATA:  Breakthrough seizure. EXAM: MRI HEAD WITHOUT AND WITH CONTRAST TECHNIQUE: Multiplanar, multiecho pulse sequences of the brain and surrounding structures were obtained without and with intravenous contrast. CONTRAST:  20mL GADAVIST GADOBUTROL 1 MMOL/ML IV SOLN COMPARISON:  Brain MRI 07/05/2012 FINDINGS: BRAIN: No acute infarct, acute hemorrhage or extra-axial collection. Normal white matter signal for age. Normal volume of brain parenchyma and CSF spaces. Midline structures are normal. The hippocampi are normal and symmetric in size and signal. The hypothalamus and mamillary bodies are normal. There is no cortical ectopia or dysplasia. VASCULAR: Major flow voids are preserved. Susceptibility-sensitive sequences show no chronic microhemorrhage or superficial siderosis. SKULL AND UPPER CERVICAL SPINE: Normal calvarium and skull base. Visualized upper cervical spine and soft tissues are normal. SINUSES/ORBITS: No paranasal sinus fluid levels or advanced mucosal thickening. No mastoid or middle ear effusion. Normal orbits. IMPRESSION: Normal brain MRI. Electronically Signed   By: Ulyses Jarred M.D.   On: 03/17/2020 03:03    Medications:  I have reviewed the patient's current medications. Scheduled: . amLODipine  5 mg Oral Daily  . enoxaparin (LOVENOX) injection  40 mg Subcutaneous Q24H  . famotidine  20 mg Oral Daily  . hydrocortisone  25 mg Rectal BID  . lacosamide  200 mg Oral BID  . levETIRAcetam  1,250 mg Oral BID  . montelukast  10 mg Oral Daily    Assessment/Plan: 29 year old male with a history ofseizure, asthma, mild intellectual disability who presented with breakthrough seizure activity. Head CT personally reviewed and shows no acute changes.  Vimpat increased to 200mg  BID and Keppra increased to 1250mg  BID.  Patient tolerating increase in medications.  Did well overnight.    Recommendations: 1. Continue seizure precautions 2. Continue antiepileptic medications at current doses.   3. Patient unable to drive, operate heavy machinery, perform activities at heights and participate in water activities until release by outpatient physician.  Stable for discharge today from a neurological standpoint.  No further neurologic intervention is recommended at this time.  If further questions arise, please call or page at that time.  Thank you for allowing neurology to participate in the care of this patient.  Patient to continue follow up on an outpatient basis.       LOS: 5 days   Alexis Goodell, MD Neurology [phone] 03/18/2020  10:14 AM

## 2020-03-18 NOTE — Progress Notes (Signed)
Called by RN because pt wanted to take pt off CPAP. Pt stated he didn't want to wear the machine any longer.. CPAP remains at bedside.

## 2020-03-18 NOTE — Discharge Summary (Signed)
[facility] Discharge Summary   Patient: Isaac Vaughn TZG:[id number]  PCP: [facility]  Date of admission: 03/13/2020   Date of discharge:  03/18/2020     Discharge Diagnoses:   Principal Problem:   Seizure [facility]) Active Problems:   Asthma   Mild intellectual disabilities   Chest pain   Admitted From: Home Disposition:  Home   Recommendations for Outpatient Follow-up:  1. PCP: In 1 week, continue to monitor BP at home, started Norvasc. 2. Follow with a neurologist in 1 month   Diet recommendation: Cardiac diet  Activity: The patient is advised to gradually reintroduce usual activities, as tolerated  Discharge Condition: stable  Code Status: Full code   History of present illness: Reviewed as per the H and P dictated on admission. Hospital Course:  Tiwan Schnitker Warrenis a 29 y.o.malewith medical history significant forseizure, asthma, mild intellectual disability who presents with concerns of seizure. Patient reportedly told brother to get help when he was outside and brother saw him fallbackwards  # Recurrent Seizure with h/o chronic Seizure. Patient remained stable yesterday, no more seizure episodes.  Vimpat increased from 1 50-200 p.o. twice daily and Keppra increased from 1000 to 1250 mg p.o. twice daily as per neurologist..  -EEG negative -seen by Dr Loretha Brasil Dr Thad Ranger-- keep one more day to monitor Due to unusual frequency of seizures, neurology recommended MRI brain with and without contrast which was normal, without any acute findings. 4/15 psych consulted for delusions and somatic symptoms.  Psych recommended no intervention and recommended to discharge patient as possible to reduce events and stress. Today patient was cleared by neurologist to be discharged on current doses of the medications and follow-up as an outpatient in 1 month. ?cough Blood, -CXR negative. -no tongue bite.  No more episodes of cough or bleeding during hospital  stay.  Patient was advised to follow with PCP if there is any recurrent problem.  # Asthma, continue Singular when able, sats stable on RA  # Mild intellectual disability, normally conversive. No active issues  # Hypertension, was not taking any medication at home. Started amlodipine 5 mg p.o. daily.  Patient was advised to monitor BP at home and follow with PCP for further titration of medication accordingly   Body mass index is 39.32 kg/m.  Nutrition Interventions: Calorie restricted diet and daily still advised to lose body weight.  No Opiates for pain control prescribed.    - [place] Controlled Substance Reporting System database was not reviewed. - Patient was instructed, not to drive, operate heavy machinery, perform activities at heights, swimming or participation in water activities or provide baby sitting services while on Pain, Sleep and Anxiety Medications; until his outpatient Physician has advised to do so again.  - Also recommended to not to take more than prescribed Pain, Sleep and Anxiety Medications.  Patient was ambulatory without any assistance. On the day of the discharge the patient's vitals were stable, and no other acute medical condition were reported by patient. the patient was felt safe to be discharge at Home   Consultants: Neurology Procedures: None  Discharge Exam: General: Appear in no distress, no Rash; Oral Mucosa Clear, moist. Cardiovascular: S1 and S2 Present, no Murmur, Respiratory: normal respiratory effort, Bilateral Air entry present and no Crackles, no wheezes Abdomen: Bowel Sound present, Soft and no tenderness, no hernia Extremities: no Pedal edema, no calf tenderness Neurology: alert and oriented to time, place, and person affect appropriate.  Filed Weights   03/16/20  0404 03/17/20 0421 03/18/20 0518  Weight: 131.9 kg 131.1 kg 131.5 kg   Vitals:   03/18/20 0600 03/18/20 0830  BP:  (!) 145/90  Pulse:  81  Resp: (!) 24 18    Temp:  97.7 F (36.5 C)  SpO2:  100%    DISCHARGE MEDICATION: Allergies as of 03/18/2020      Reactions   Other Other (See Comments)   Seasonal allergies: runny nose, congestion, watery eyes, sneezing      Medication List    TAKE these medications   albuterol 108 (90 Base) MCG/ACT inhaler Commonly known as: VENTOLIN HFA Inhale 2 puffs into the lungs every 6 (six) hours as needed for wheezing or shortness of breath.   amLODipine 5 MG tablet Commonly known as: NORVASC Take 1 tablet (5 mg total) by mouth daily. Start taking on: March 19, 2020   dicyclomine 20 MG tablet Commonly known as: BENTYL Take 20 mg by mouth every 6 (six) hours as needed for cramping.   famotidine 20 MG tablet Commonly known as: Pepcid Take 1 tablet (20 mg total) by mouth daily.   levETIRAcetam 100 MG/ML solution Commonly known as: KEPPRA Take 12.5 mLs (1,250 mg total) by mouth 2 (two) times daily. What changed: See the new instructions.   montelukast 10 MG tablet Commonly known as: SINGULAIR Take 1 tablet (10 mg total) by mouth daily.   Nayzilam 5 MG/0.1ML Soln Generic drug: Midazolam Place 5 mg into the nose as directed. May repeat in other nostril after 10 minutes if needed   ondansetron 4 MG disintegrating tablet Commonly known as: ZOFRAN-ODT Take by mouth.   polyethylene glycol 17 g packet Commonly known as: MIRALAX / GLYCOLAX Take 17 g by mouth 3 (three) times daily.   Vimpat 50 MG Tabs tablet Generic drug: lacosamide Take 4 tablets (200 mg total) by mouth 2 (two) times daily. What changed: how much to take      Allergies  Allergen Reactions  . Other Other (See Comments)    Seasonal allergies: runny nose, congestion, watery eyes, sneezing   Discharge Instructions    Call MD for:   Complete by: As directed    Seizures, headache   Diet - low sodium heart healthy   Complete by: As directed    Discharge instructions   Complete by: As directed    Follow with PCP in 1 week,  continue monitor blood pressure at home and follow with PCP to titrate medications accordingly.  Patient was started on amlodipine Follow with neurologist in 1 to 2 weeks   Increase activity slowly   Complete by: As directed       The results of significant diagnostics from this hospitalization (including imaging, microbiology, ancillary and laboratory) are listed below for reference.    Significant Diagnostic Studies: EEG  Result Date: 03/15/2020 Alexis Goodell, MD     03/15/2020 10:28 AM ELECTROENCEPHALOGRAM REPORT Patient: JAMASON PECKHAM       Room #: 921J-HE EEG No. ID: [id number] Age: 29 y.o.        Sex: male Requesting Physician: Posey Pronto Report Date:  03/15/2020       Interpreting Physician: Alexis Goodell History: INOCENCIO ROY is an 29 y.o. male with a history of seizures Medications: Norvasc, Vimpat, Keppra Conditions of Recording:  This is a 21 channel routine scalp EEG performed with bipolar and monopolar montages arranged in accordance to the international 10/20 system of electrode placement. One channel was dedicated to EKG recording. The patient  is in the awake, drowsy and asleep states. Description:  The waking background activity consists of a low voltage, symmetrical, fairly well organized, 9 Hz alpha activity, seen from the parieto-occipital and posterior temporal regions.  Low voltage fast activity, poorly organized, is seen anteriorly and is at times superimposed on more posterior regions.  A mixture of theta and alpha rhythms are seen from the central and temporal regions. The patient drowses with slowing to irregular, low voltage theta and beta activity.  The patient goes in to a light sleep with symmetrical sleep spindles, vertex central sharp transients and irregular slow activity.  No epileptiform activity is noted.  Hyperventilation was not performed.  Intermittent photic stimulation was performed but failed to illicit any change in the tracing.  IMPRESSION: Normal  electroencephalogram, awake, asleep and with activation procedures. There are no focal lateralizing or epileptiform features. Thana FarrLeslie Reynolds, MD Neurology [phone] 03/15/2020, 10:23 AM   CT Head Wo Contrast  Result Date: 03/13/2020 CLINICAL DATA:  History of trauma to back of head. EXAM: CT HEAD WITHOUT CONTRAST CT CERVICAL SPINE WITHOUT CONTRAST TECHNIQUE: Multidetector CT imaging of the head and cervical spine was performed following the standard protocol without intravenous contrast. Multiplanar CT image reconstructions of the cervical spine were also generated. COMPARISON:  04/22/2019 FINDINGS: CT HEAD FINDINGS Brain: No evidence of acute infarction, hemorrhage, hydrocephalus, extra-axial collection or mass lesion/mass effect. Vascular: No hyperdense vessel or unexpected calcification. Skull: Normal. Negative for fracture or focal lesion. Sinuses/Orbits: Visualized paranasal sinuses and orbits are unremarkable. Other: Small scalp hematoma along the right frontoparietal scalp near the vertex. CT CERVICAL SPINE FINDINGS Alignment: Mild straightening of normal cervical lordosis. May be positional or related to spasm. Skull base and vertebrae: No acute fracture. No primary bone lesion or focal pathologic process. Soft tissues and spinal canal: No prevertebral fluid or swelling. No visible canal hematoma. Disc levels: Mild spinal degenerative changes at C4-5, C5-6 and C6-7. Upper chest: Negative. Other: None IMPRESSION: 1. No acute intracranial abnormality. 2. Small right frontoparietal scalp hematoma near the vertex. 3. No acute fracture or subluxation of the cervical spine. Electronically Signed   By: Donzetta KohutGeoffrey  Wile M.D.   On: 03/13/2020 17:31   CT Cervical Spine Wo Contrast  Result Date: 03/13/2020 CLINICAL DATA:  History of trauma to back of head. EXAM: CT HEAD WITHOUT CONTRAST CT CERVICAL SPINE WITHOUT CONTRAST TECHNIQUE: Multidetector CT imaging of the head and cervical spine was performed following  the standard protocol without intravenous contrast. Multiplanar CT image reconstructions of the cervical spine were also generated. COMPARISON:  04/22/2019 FINDINGS: CT HEAD FINDINGS Brain: No evidence of acute infarction, hemorrhage, hydrocephalus, extra-axial collection or mass lesion/mass effect. Vascular: No hyperdense vessel or unexpected calcification. Skull: Normal. Negative for fracture or focal lesion. Sinuses/Orbits: Visualized paranasal sinuses and orbits are unremarkable. Other: Small scalp hematoma along the right frontoparietal scalp near the vertex. CT CERVICAL SPINE FINDINGS Alignment: Mild straightening of normal cervical lordosis. May be positional or related to spasm. Skull base and vertebrae: No acute fracture. No primary bone lesion or focal pathologic process. Soft tissues and spinal canal: No prevertebral fluid or swelling. No visible canal hematoma. Disc levels: Mild spinal degenerative changes at C4-5, C5-6 and C6-7. Upper chest: Negative. Other: None IMPRESSION: 1. No acute intracranial abnormality. 2. Small right frontoparietal scalp hematoma near the vertex. 3. No acute fracture or subluxation of the cervical spine. Electronically Signed   By: Donzetta KohutGeoffrey  Wile M.D.   On: 03/13/2020 17:31   MR BRAIN W  WO CONTRAST  Result Date: 03/17/2020 CLINICAL DATA:  Breakthrough seizure. EXAM: MRI HEAD WITHOUT AND WITH CONTRAST TECHNIQUE: Multiplanar, multiecho pulse sequences of the brain and surrounding structures were obtained without and with intravenous contrast. CONTRAST:  32mL GADAVIST GADOBUTROL 1 MMOL/ML IV SOLN COMPARISON:  Brain MRI 07/05/2012 FINDINGS: BRAIN: No acute infarct, acute hemorrhage or extra-axial collection. Normal white matter signal for age. Normal volume of brain parenchyma and CSF spaces. Midline structures are normal. The hippocampi are normal and symmetric in size and signal. The hypothalamus and mamillary bodies are normal. There is no cortical ectopia or dysplasia.  VASCULAR: Major flow voids are preserved. Susceptibility-sensitive sequences show no chronic microhemorrhage or superficial siderosis. SKULL AND UPPER CERVICAL SPINE: Normal calvarium and skull base. Visualized upper cervical spine and soft tissues are normal. SINUSES/ORBITS: No paranasal sinus fluid levels or advanced mucosal thickening. No mastoid or middle ear effusion. Normal orbits. IMPRESSION: Normal brain MRI. Electronically Signed   By: Deatra Robinson M.D.   On: 03/17/2020 03:03   DG Chest Port 1 View  Result Date: 03/15/2020 CLINICAL DATA:  Cough with occasional hemoptysis EXAM: PORTABLE CHEST 1 VIEW COMPARISON:  Apr 23, 2019 FINDINGS: Lungs are clear. Heart size and pulmonary vascularity are normal. No adenopathy. No bone lesions. IMPRESSION: Lungs clear.  Cardiac silhouette within normal limits. Electronically Signed   By: Bretta Bang III M.D.   On: 03/15/2020 10:19    Microbiology: Recent Results (from the past 240 hour(s))  SARS CORONAVIRUS 2 (TAT 6-24 HRS) Nasopharyngeal Nasopharyngeal Swab     Status: None   Collection Time: 03/13/20  4:02 PM   Specimen: Nasopharyngeal Swab  Result Value Ref Range Status   SARS Coronavirus 2 NEGATIVE NEGATIVE Final    Comment: (NOTE) SARS-CoV-2 target nucleic acids are NOT DETECTED. The SARS-CoV-2 RNA is generally detectable in upper and lower respiratory specimens during the acute phase of infection. Negative results do not preclude SARS-CoV-2 infection, do not rule out co-infections with other pathogens, and should not be used as the sole basis for treatment or other patient management decisions. Negative results must be combined with clinical observations, patient history, and epidemiological information. The expected result is Negative. Fact Sheet for Patients: HairSlick.no Fact Sheet for Healthcare Providers: quierodirigir.com This test is not yet approved or cleared by the  [place] FDA and  has been authorized for detection and/or diagnosis of SARS-CoV-2 by FDA under an Emergency Use Authorization (EUA). This EUA will remain  in effect (meaning this test can be used) for the duration of the COVID-19 declaration under Section 56 4(b)(1) of the Act, 21 U.S.C. section 360bbb-3(b)(1), unless the authorization is terminated or revoked sooner. Performed at [facility], [address] [place], [place] [address]      Labs: CBC: Recent Labs  Lab 03/13/20 1511  WBC 6.3  HGB 16.3  HCT 48.3  MCV 96.4  PLT 216   Basic Metabolic Panel: Recent Labs  Lab 03/13/20 1511 03/17/20 0528  NA 137 139  K 4.4 3.7  CL 104 106  CO2 27 27  GLUCOSE 87 91  BUN 12 14  CREATININE 1.23 1.09  CALCIUM 9.4 9.1  MG  --  2.1   Liver Function Tests: No results for input(s): AST, ALT, ALKPHOS, BILITOT, PROT, ALBUMIN in the last 168 hours. No results for input(s): LIPASE, AMYLASE in the last 168 hours. No results for input(s): AMMONIA in the last 168 hours. Cardiac Enzymes: No results for input(s): CKTOTAL, CKMB, CKMBINDEX, TROPONINI  in the last 168 hours. BNP (last 3 results) No results for input(s): BNP in the last 8760 hours. CBG: No results for input(s): GLUCAP in the last 168 hours.  Time spent: 35 minutes  Signed:  Gillis Santa  [facility]  03/18/2020 10:54 AM

## 2020-03-18 NOTE — Discharge Instructions (Signed)
Seizure, Adult A seizure is a sudden burst of abnormal electrical activity in the brain. Seizures usually last from 30 seconds to 2 minutes. The abnormal activity temporarily interrupts normal brain function. A seizure can cause many different symptoms depending on where in the brain it starts. What are the causes? Common causes of this condition include:  Fever or infection.  Brain abnormality, injury, bleeding, or tumor.  Low blood sugar.  Metabolic disorders or other conditions that are passed from parent to child (are inherited).  Reaction to a substance, such as a drug or a medicine, or suddenly stopping the use of a substance (withdrawal).  Stroke.  Developmental disorders such as autism or cerebral palsy. In some cases, the cause of this condition may not be known. Some people who have a seizure never have another one. Seizures usually do not cause brain damage or permanent problems unless they are prolonged. A person who has repeated seizures over time without a clear cause has a condition called epilepsy. What increases the risk? You are more likely to develop this condition if you have:  A family history of epilepsy.  Had a tonic-clonic seizure in the past. This is a type of seizure that involves whole-body contraction of muscles and a loss of consciousness.  Autism, cerebral palsy, or other brain disorders.  A history of head trauma, lack of oxygen at birth, or strokes. What are the signs or symptoms? There are many different types of seizures. The symptoms of a seizure vary depending on the type of seizure you have. Examples of symptoms during a seizure include:  Uncontrollable shaking (convulsions).  Stiffening of the body.  Loss of consciousness.  Head nodding.  Staring.  Not responding to sound or touch.  Loss of bladder or bowel control. Some people have symptoms right before a seizure happens (aura) and right after a seizure happens (postictal). Symptoms  before a seizure may include:  Fear or anxiety.  Nausea.  Feeling like the room is spinning (vertigo).  A feeling of having seen or heard something before (dj vu).  Odd tastes or smells.  Changes in vision, such as seeing flashing lights or spots. Symptoms after a seizure may include:  Confusion.  Sleepiness.  Headache.  Weakness on one side of the body. How is this diagnosed? This condition may be diagnosed based on:  A description of your symptoms. Video of your seizures can be helpful.  Your medical history.  A physical exam. You may also have tests, including:  Blood tests.  CT scan.  MRI.  Electroencephalogram (EEG). This test measures electrical activity in the brain. An EEG can predict whether seizures will return (recur).  A spinal tap (also called a lumbar puncture). This is the removal and testing of fluid that surrounds the brain and spinal cord. How is this treated? Most seizures will stop on their own in under 5 minutes, and no treatment is needed. Seizures that last longer than 5 minutes will usually need treatment. Treatment can include:  Medicines given through an IV.  Avoiding known triggers, such as medicines that you take for another condition.  Medicines to treat epilepsy (antiepileptics), if epilepsy caused your seizures.  Surgery to stop seizures, if you have epilepsy that does not respond to medicines. Follow these instructions at home: Medicines  Take over-the-counter and prescription medicines only as told by your health care provider.  Avoid any substances that may prevent your medicine from working properly, such as alcohol. Activity  Do not drive,   swim, or do any other activities that would be dangerous if you had another seizure. Wait until your health care provider says it is safe to do them.  If you live in the [place] check with your local DMV (department of motor vehicles) to find out about local driving laws. Each state  has specific rules about when you can legally return to driving.  Get enough rest. Lack of sleep can make seizures more likely to occur. Educating others Teach friends and family what to do if you have a seizure. They should:  Lay you on the ground to prevent a fall.  Cushion your head and body.  Loosen any tight clothing around your neck.  Turn you on your side. If vomiting occurs, this helps keep your airway clear.  Not hold you down. Holding you down will not stop the seizure.  Not put anything into your mouth.  Know whether or not you need emergency care. For example, they should get help right away if you have a seizure that lasts longer than 5 minutes or have several seizures in a row.  Stay with you until you recover.  General instructions  Contact your health care provider each time you have a seizure.  Avoid anything that has ever triggered a seizure for you.  Keep a seizure diary. Record what you remember about each seizure, especially anything that might have triggered the seizure.  Keep all follow-up visits as told by your health care provider. This is important. Contact a health care provider if:  You have another seizure.  You have seizures more often.  Your seizure symptoms change.  You continue to have seizures with treatment.  You have symptoms of an infection or illness. This might increase your risk of having a seizure. Get help right away if:  You have a seizure that: ? Lasts longer than 5 minutes. ? Is different than previous seizures. ? Leaves you unable to speak or use a part of your body. ? Makes it harder to breathe.  You have: ? A seizure after a head injury. ? Multiple seizures in a row. ? Confusion or a severe headache right after a seizure.  You do not wake up immediately after a seizure.  You injure yourself during a seizure. These symptoms may represent a serious problem that is an emergency. Do not wait to see if the symptoms  will go away. Get medical help right away. Call your local emergency services (911 in the [place] Do not drive yourself to the hospital. Summary  Seizures are caused by abnormal electrical activity in the brain. The activity disrupts normal brain function and can cause various symptoms, such as convulsions, abnormal movements, or a change in consciousness.  There are many causes of seizures, including illnesses, medicines, genetic conditions, head injuries, strokes, tumors, substance abuse, or substance withdrawal.  Most seizures will stop on their own in under 5 minutes. Seizures that last longer than 5 minutes are a medical emergency and require immediate treatment.  Many medicines are used to treat seizures. Take over-the-counter and prescription medicines only as told by your health care provider. This information is not intended to replace advice given to you by your health care provider. Make sure you discuss any questions you have with your health care provider. Document Revised: 02/06/2019 Document Reviewed: 02/06/2019 Elsevier Patient Education  2020 [employer]  

## 2020-08-02 ENCOUNTER — Emergency Department
Admission: EM | Admit: 2020-08-02 | Discharge: 2020-08-03 | Disposition: A | Discharge status: Home / Self Care | Payer: Medicaid Other | Attending: Emergency Medicine | Admitting: Emergency Medicine

## 2020-08-02 ENCOUNTER — Encounter: Payer: Self-pay | Admitting: *Deleted

## 2020-08-02 DIAGNOSIS — G40909 Epilepsy, unspecified, not intractable, without status epilepticus: Secondary | ICD-10-CM

## 2020-08-02 DIAGNOSIS — Z79899 Other long term (current) drug therapy: Secondary | ICD-10-CM | POA: Diagnosis not present

## 2020-08-02 DIAGNOSIS — J45909 Unspecified asthma, uncomplicated: Secondary | ICD-10-CM | POA: Insufficient documentation

## 2020-08-02 DIAGNOSIS — R569 Unspecified convulsions: Secondary | ICD-10-CM | POA: Insufficient documentation

## 2020-08-02 LAB — CBC WITH DIFFERENTIAL/PLATELET
Abs Immature Granulocytes: 0.02 10*3/uL (ref 0.00–0.07)
Basophils Absolute: 0 10*3/uL (ref 0.0–0.1)
Basophils Relative: 0 %
Eosinophils Absolute: 0.1 10*3/uL (ref 0.0–0.5)
Eosinophils Relative: 2 %
HCT: 42 % (ref 39.0–52.0)
Hemoglobin: 14.3 g/dL (ref 13.0–17.0)
Immature Granulocytes: 0 %
Lymphocytes Relative: 32 %
Lymphs Abs: 2.1 10*3/uL (ref 0.7–4.0)
MCH: 32.1 pg (ref 26.0–34.0)
MCHC: 34 g/dL (ref 30.0–36.0)
MCV: 94.4 fL (ref 80.0–100.0)
Monocytes Absolute: 0.4 10*3/uL (ref 0.1–1.0)
Monocytes Relative: 7 %
Neutro Abs: 3.9 10*3/uL (ref 1.7–7.7)
Neutrophils Relative %: 59 %
Platelets: 203 10*3/uL (ref 150–400)
RBC: 4.45 MIL/uL (ref 4.22–5.81)
RDW: 12.4 % (ref 11.5–15.5)
WBC: 6.5 10*3/uL (ref 4.0–10.5)
nRBC: 0 % (ref 0.0–0.2)

## 2020-08-02 LAB — BASIC METABOLIC PANEL
Anion gap: 10 (ref 5–15)
BUN: 15 mg/dL (ref 6–20)
CO2: 25 mmol/L (ref 22–32)
Calcium: 8.9 mg/dL (ref 8.9–10.3)
Chloride: 103 mmol/L (ref 98–111)
Creatinine, Ser: 1.02 mg/dL (ref 0.61–1.24)
GFR calc Af Amer: >60 mL/min (ref >60)
GFR calc non Af Amer: >60 mL/min (ref >60)
Glucose, Bld: 113 mg/dL — ABNORMAL HIGH (ref 70–99)
Potassium: 3.3 mmol/L — ABNORMAL LOW (ref 3.5–5.1)
Sodium: 138 mmol/L (ref 135–145)

## 2020-08-02 MED ORDER — LEVETIRACETAM IN NACL 1500 MG/100ML IV SOLN
1500.0000 mg | Freq: Once | INTRAVENOUS | Status: AC
  Administered 2020-08-02: 1500 mg via INTRAVENOUS
  Filled 2020-08-02: qty 100

## 2020-08-02 NOTE — ED Notes (Signed)
Report given to [facility]. Patient is resting, seizure pads in place. Room is darkened for comfort.

## 2020-08-02 NOTE — ED Notes (Signed)
Patient's mother called and said she would be here soon.

## 2020-08-02 NOTE — ED Provider Notes (Signed)
[facility] Emergency Department Provider Note ____________________________________________   First MD Initiated Contact with Patient 08/02/20 2021     (approximate)  I have reviewed the triage vital signs and the nursing notes.   HISTORY  Chief Complaint Seizures  Level 5 caveat: History of present illness limited due to altered mental status  HPI BRANTON EINSTEIN is a 29 y.o. male with PMH as noted below including a seizure disorder on Keppra and Vimpat who presents via EMS after an apparent witnessed seizure at home.  Per EMS, family stated that the patient now appeared to be in his normal postictal state.  It is unknown when his most recent seizure was.  The patient himself is unable to give any history.  Past Medical History:  Diagnosis Date  . Asthma   . Epilepsy (HCC)   . Seizures [facility])     Patient Active Problem List   Diagnosis Date Noted  . Seizure (HCC) 03/13/2020  . Asthma 03/13/2020  . 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 Chest pain 03/13/2020  . Altered mental status 06/07/2016    History reviewed. No pertinent surgical history.  Prior to Admission medications   Medication Sig Start Date End Date Taking? Authorizing Provider  albuterol (VENTOLIN HFA) 108 (90 Base) MCG/ACT inhaler Inhale 2 puffs into the lungs every 6 (six) hours as needed for wheezing or shortness of breath.    [provider]  amLODipine (NORVASC) 5 MG tablet Take 1 tablet (5 mg total) by mouth daily. 03/19/20 04/18/20  Gillis Santa, MD  dicyclomine (BENTYL) 20 MG tablet Take 20 mg by mouth every 6 (six) hours as needed for cramping. 04/29/19 03/13/20  [provider]  famotidine (PEPCID) 20 MG tablet Take 1 tablet (20 mg total) by mouth daily. 07/17/19 07/16/20  Phineas Semen, MD  levETIRAcetam (KEPPRA) 100 MG/ML solution Take 12.5 mLs (1,250 mg total) by mouth 2 (two) times daily. 03/18/20 04/17/20  Gillis Santa, MD  Midazolam (NAYZILAM) 5  MG/0.1ML SOLN Place 5 mg into the nose as directed. May repeat in other nostril after 10 minutes if needed    [provider]  montelukast (SINGULAIR) 10 MG tablet Take 1 tablet (10 mg total) by mouth daily. 12/11/17   Joni Reining, PA-C  ondansetron (ZOFRAN-ODT) 4 MG disintegrating tablet Take by mouth. 05/04/19   [provider]  polyethylene glycol (MIRALAX / GLYCOLAX) 17 g packet Take 17 g by mouth 3 (three) times daily. 04/29/19   [provider]  VIMPAT 50 MG TABS tablet Take 4 tablets (200 mg total) by mouth 2 (two) times daily. 03/18/20 04/17/20  Gillis Santa, MD    Allergies Other  Family History  Problem Relation Age of Onset  . Hypertension Mother   . Asthma Mother     Social History Social History   Tobacco Use  . Smoking status: Never Smoker  . Smokeless tobacco: Never Used  Substance Use Topics  . Alcohol use: No    Alcohol/week: 0.0 standard drinks  . Drug use: No    Review of Systems Level 5 caveat: Unable to obtain review of systems due to altered mental status   ____________________________________________   PHYSICAL EXAM:  VITAL SIGNS: ED Triage Vitals  Enc Vitals Group     BP 08/02/20 2027 (!) 150/95     Pulse Rate 08/02/20 2027 86     Resp 08/02/20 2027 18     Temp 08/02/20 2027 98.6 F (37 C)  Temp Source 08/02/20 2027 Oral     SpO2 08/02/20 2027 95 %     Weight 08/02/20 2028 (!) 310 lb 13.6 oz (141 kg)     Height --      Head Circumference --      Peak Flow --      Pain Score --      Pain Loc --      Pain Edu? --      Excl. in GC? --     Constitutional: Somnolent, but somewhat arousable and following commands. Eyes: Conjunctivae are normal.  EOMI.  PERRLA. Head: Atraumatic. Nose: No congestion/rhinnorhea. Mouth/Throat: Mucous membranes are moist.   Neck: Normal range of motion.  Cardiovascular: Normal rate, regular rhythm. Grossly normal heart sounds.  Good peripheral circulation. Respiratory: Normal  respiratory effort.  No retractions. Lungs CTAB. Gastrointestinal: Soft and nontender. No distention.  Genitourinary: No flank tenderness. Musculoskeletal: No lower extremity edema.  Extremities warm and well perfused.  Neurologic: Motor intact in all extremities. Skin:  Skin is warm and dry. No rash noted. Psychiatric: Unable to assess.  ____________________________________________   LABS (all labs ordered are listed, but only abnormal results are displayed)  Labs Reviewed  BASIC METABOLIC PANEL - Abnormal; Notable for the following components:      Result Value   Potassium 3.3 (*)    Glucose, Bld 113 (*)    All other components within normal limits  CBC WITH DIFFERENTIAL/PLATELET  LEVETIRACETAM LEVEL   ____________________________________________  EKG  ED ECG REPORT I, Dionne Bucy, the attending physician, personally viewed and interpreted this ECG.  Date: 08/02/2020 EKG Time: 2026 Rate: 84 Rhythm: normal sinus rhythm QRS Axis: normal Intervals: normal ST/T Wave abnormalities: Nonspecific abnormalities Narrative Interpretation: Nonspecific abnormalities with no evidence of acute ischemia; no significant change when compared to EKG of March 13, 2020  ____________________________________________  RADIOLOGY    ____________________________________________   PROCEDURES  Procedure(s) performed: No  Procedures  Critical Care performed: No ____________________________________________   INITIAL IMPRESSION / ASSESSMENT AND PLAN / ED COURSE  Pertinent labs & imaging results that were available during my care of the patient were reviewed by me and considered in my medical decision making (see chart for details).  29 year old male with PMH as noted above including a known seizure disorder and intellectual disability presents with an apparent witnessed seizure at home.  He now appears somnolent and postictal.  It is unknown when his most recent seizure  occurred.  I reviewed the past medical records in Epic.  The patient was most recently seen in the ED with breakthrough seizures in April, and was admitted for observation after prolonged postictal state.  His medication doses were increased at that time.  On exam currently, the patient is somnolent but arousable.  He is following commands.  Neurologic exam is nonfocal, although he is currently not speaking.  Presentation is consistent with breakthrough seizure with known seizure disorder.  There is no evidence of ongoing seizure activity.  We will obtain labs, give a dose of IV Keppra as the patient has not yet taken his evening dose, and observe for a few hours.  If the patient returns to baseline and has no further seizure activity anticipate possible discharge home.  ----------------------------------------- 11:11 PM on 08/02/2020 -----------------------------------------  Lab work-up is unremarkable.  The patient's vital signs remained stable and he is more arousable.  He will need further observation likely for a few hours.  I signed him out to the oncoming physician Dr. Manson Passey.  ____________________________________________   FINAL CLINICAL IMPRESSION(S) / ED DIAGNOSES  Final diagnoses:  Seizure disorder (HCC)      NEW MEDICATIONS STARTED DURING THIS VISIT:  New Prescriptions   No medications on file     Note:  This document was prepared using Dragon voice recognition software and may include unintentional dictation errors.    Dionne Bucy, MD 08/02/20 2312

## 2020-08-02 NOTE — ED Triage Notes (Signed)
Per EMS report, patient had a witnessed seizure at home. Patient is in his normal post-ictal state per mother's report to EMT. Patient has history of seizures and had daily dose of Keppra.

## 2020-08-03 NOTE — ED Notes (Signed)
Pt ambulated to restroom with minimal assistance, given crackers and sprite

## 2020-08-03 NOTE — ED Provider Notes (Signed)
I assumed care of the patient from Dr. Marisa Severin at 11:00 PM.  On reevaluation patient's mother currently at bedside states that her son is at his baseline level of mental status.  Patient is alert and oriented no evidence of postictal state at present.   Darci Current, MD 08/03/20 [phone]

## 2020-08-03 NOTE — ED Notes (Signed)
Mother at bedside.

## 2020-08-04 ENCOUNTER — Other Ambulatory Visit: Payer: Self-pay

## 2020-08-04 DIAGNOSIS — R112 Nausea with vomiting, unspecified: Secondary | ICD-10-CM | POA: Insufficient documentation

## 2020-08-04 DIAGNOSIS — R0602 Shortness of breath: Secondary | ICD-10-CM | POA: Insufficient documentation

## 2020-08-04 DIAGNOSIS — M549 Dorsalgia, unspecified: Secondary | ICD-10-CM | POA: Insufficient documentation

## 2020-08-04 DIAGNOSIS — R109 Unspecified abdominal pain: Secondary | ICD-10-CM | POA: Insufficient documentation

## 2020-08-04 DIAGNOSIS — Z5321 Procedure and treatment not carried out due to patient leaving prior to being seen by health care provider: Secondary | ICD-10-CM | POA: Insufficient documentation

## 2020-08-04 LAB — COMPREHENSIVE METABOLIC PANEL
ALT: 25 U/L (ref 0–44)
AST: 18 U/L (ref 15–41)
Albumin: 4.1 g/dL (ref 3.5–5.0)
Alkaline Phosphatase: 41 U/L (ref 38–126)
Anion gap: 8 (ref 5–15)
BUN: 10 mg/dL (ref 6–20)
CO2: 28 mmol/L (ref 22–32)
Calcium: 9 mg/dL (ref 8.9–10.3)
Chloride: 103 mmol/L (ref 98–111)
Creatinine, Ser: 0.98 mg/dL (ref 0.61–1.24)
GFR calc Af Amer: >60 mL/min (ref >60)
GFR calc non Af Amer: >60 mL/min (ref >60)
Glucose, Bld: 90 mg/dL (ref 70–99)
Potassium: 3.6 mmol/L (ref 3.5–5.1)
Sodium: 139 mmol/L (ref 135–145)
Total Bilirubin: 0.8 mg/dL (ref 0.3–1.2)
Total Protein: 7.7 g/dL (ref 6.5–8.1)

## 2020-08-04 LAB — CBC
HCT: 44.9 % (ref 39.0–52.0)
Hemoglobin: 15.7 g/dL (ref 13.0–17.0)
MCH: 32.5 pg (ref 26.0–34.0)
MCHC: 35 g/dL (ref 30.0–36.0)
MCV: 93 fL (ref 80.0–100.0)
Platelets: 226 10*3/uL (ref 150–400)
RBC: 4.83 MIL/uL (ref 4.22–5.81)
RDW: 12.5 % (ref 11.5–15.5)
WBC: 5.4 10*3/uL (ref 4.0–10.5)
nRBC: 0 % (ref 0.0–0.2)

## 2020-08-04 LAB — LIPASE, BLOOD: Lipase: 32 U/L (ref 11–51)

## 2020-08-04 NOTE — ED Triage Notes (Signed)
PT to ED via [facility] from home c/o [facility], abd pain, back pain and nausea with 1 episode of vomiting. No CP. SX started yesterday after pt received dose of IV keppra here. PT mother (caregiver) says this happens after large IV dose of keppra. No known covid exposure

## 2020-08-05 ENCOUNTER — Emergency Department
Admission: EM | Admit: 2020-08-05 | Discharge: 2020-08-05 | Disposition: A | Discharge status: Home / Self Care | Payer: Medicaid Other | Attending: Emergency Medicine | Admitting: Emergency Medicine

## 2020-08-05 ENCOUNTER — Emergency Department: Payer: Medicaid Other

## 2020-08-05 HISTORY — DX: Unspecified lack of expected normal physiological development in childhood: R62.50

## 2020-08-05 LAB — URINALYSIS, COMPLETE (UACMP) WITH MICROSCOPIC
Bacteria, UA: NONE SEEN
Bilirubin Urine: NEGATIVE
Glucose, UA: NEGATIVE mg/dL
Hgb urine dipstick: NEGATIVE
Ketones, ur: NEGATIVE mg/dL
Leukocytes,Ua: NEGATIVE
Nitrite: NEGATIVE
Protein, ur: NEGATIVE mg/dL
Specific Gravity, Urine: 1.026 (ref 1.005–1.030)
pH: 5 (ref 5.0–8.0)

## 2020-08-05 LAB — TROPONIN I (HIGH SENSITIVITY): Troponin I (High Sensitivity): 4 ng/L (ref <18)

## 2020-08-05 LAB — LEVETIRACETAM LEVEL: Levetiracetam Lvl: 13.5 ug/mL (ref 10.0–40.0)

## 2020-08-05 NOTE — ED Notes (Signed)
No answer when called several times from lobby 

## 2021-02-03 ENCOUNTER — Other Ambulatory Visit: Payer: Self-pay

## 2021-02-03 ENCOUNTER — Emergency Department
Admission: EM | Admit: 2021-02-03 | Discharge: 2021-02-04 | Disposition: A | Discharge status: Home / Self Care | Payer: Medicaid Other | Attending: Emergency Medicine | Admitting: Emergency Medicine

## 2021-02-03 ENCOUNTER — Emergency Department: Payer: Medicaid Other

## 2021-02-03 DIAGNOSIS — Z79899 Other long term (current) drug therapy: Secondary | ICD-10-CM | POA: Insufficient documentation

## 2021-02-03 DIAGNOSIS — G40909 Epilepsy, unspecified, not intractable, without status epilepticus: Secondary | ICD-10-CM | POA: Insufficient documentation

## 2021-02-03 DIAGNOSIS — R569 Unspecified convulsions: Secondary | ICD-10-CM

## 2021-02-03 DIAGNOSIS — J45909 Unspecified asthma, uncomplicated: Secondary | ICD-10-CM | POA: Diagnosis not present

## 2021-02-03 DIAGNOSIS — M25511 Pain in right shoulder: Secondary | ICD-10-CM | POA: Diagnosis not present

## 2021-02-03 LAB — COMPREHENSIVE METABOLIC PANEL
ALT: 25 U/L (ref 0–44)
AST: 18 U/L (ref 15–41)
Albumin: 4.3 g/dL (ref 3.5–5.0)
Alkaline Phosphatase: 51 U/L (ref 38–126)
Anion gap: 11 (ref 5–15)
BUN: 11 mg/dL (ref 6–20)
CO2: 25 mmol/L (ref 22–32)
Calcium: 9.3 mg/dL (ref 8.9–10.3)
Chloride: 103 mmol/L (ref 98–111)
Creatinine, Ser: 1.27 mg/dL — ABNORMAL HIGH (ref 0.61–1.24)
GFR, Estimated: >60 mL/min (ref >60)
Glucose, Bld: 79 mg/dL (ref 70–99)
Potassium: 3.7 mmol/L (ref 3.5–5.1)
Sodium: 139 mmol/L (ref 135–145)
Total Bilirubin: 0.8 mg/dL (ref 0.3–1.2)
Total Protein: 7.9 g/dL (ref 6.5–8.1)

## 2021-02-03 LAB — CBC WITH DIFFERENTIAL/PLATELET
Abs Immature Granulocytes: 0.02 10*3/uL (ref 0.00–0.07)
Basophils Absolute: 0 10*3/uL (ref 0.0–0.1)
Basophils Relative: 0 %
Eosinophils Absolute: 0.2 10*3/uL (ref 0.0–0.5)
Eosinophils Relative: 2 %
HCT: 44.5 % (ref 39.0–52.0)
Hemoglobin: 14.8 g/dL (ref 13.0–17.0)
Immature Granulocytes: 0 %
Lymphocytes Relative: 35 %
Lymphs Abs: 2.7 10*3/uL (ref 0.7–4.0)
MCH: 31.4 pg (ref 26.0–34.0)
MCHC: 33.3 g/dL (ref 30.0–36.0)
MCV: 94.5 fL (ref 80.0–100.0)
Monocytes Absolute: 0.5 10*3/uL (ref 0.1–1.0)
Monocytes Relative: 6 %
Neutro Abs: 4.2 10*3/uL (ref 1.7–7.7)
Neutrophils Relative %: 57 %
Platelets: 217 10*3/uL (ref 150–400)
RBC: 4.71 MIL/uL (ref 4.22–5.81)
RDW: 12.2 % (ref 11.5–15.5)
WBC: 7.6 10*3/uL (ref 4.0–10.5)
nRBC: 0 % (ref 0.0–0.2)

## 2021-02-03 MED ORDER — LEVETIRACETAM IN NACL 1000 MG/100ML IV SOLN
1000.0000 mg | Freq: Once | INTRAVENOUS | Status: AC
  Administered 2021-02-03: 1000 mg via INTRAVENOUS
  Filled 2021-02-03: qty 100

## 2021-02-03 NOTE — ED Provider Notes (Signed)
-----------------------------------------   11:07 PM on 02/03/2021 -----------------------------------------  Assuming care from Dr. Delton Prairie.  In short, Isaac Vaughn is a 30 y.o. male with a chief complaint of seizure.  Refer to the original H&P for additional details.  The current plan of care is to reassess as he continues to become less postictal.  He is currently awake and alert but is complaining of some right shoulder pain and x-rays are pending to rule out dislocation.    ----------------------------------------- 2:16 AM on 02/04/2021 -----------------------------------------  There is a problem with the radiology system at the moment which is preventing some images and reports from crossing over.  I personally reviewed the shoulder x-rays and did not see any acute abnormalities but I called [facility] radiology and they read the report to me which verifies that the radiologist (Dr. Phill Myron) did not see any acute abnormalities including fracture nor dislocation.  I went back to update the patient and his mother.  The patient is awake and alert, watching TV, no acute distress.  He said his shoulder hurts but there is no gross deformity and it correlates with musculoskeletal strain.  Mother is very comfortable with the plan for discharge home and outpatient follow-up.  I gave my usual and customary return precautions.  Of note, I asked the radiology team to fax [place] the reports on the patient's head CT and shoulder x-rays so that we can scan them into the system since they are not crossing over.   Loleta Rose, MD 02/04/21 [phone]

## 2021-02-03 NOTE — ED Triage Notes (Signed)
Per EMS, 2 witnessed grand mal seizures at home. Lasting appox 1 minute. EMS reports family was doing chest compressions on arrival, pt breathing with pulses at the time.

## 2021-02-03 NOTE — ED Provider Notes (Signed)
[facility] Emergency Department Provider Note ____________________________________________   Event Date/Time   First MD Initiated Contact with Patient 02/03/21 2020     (approximate)  I have reviewed the triage vital signs and the nursing notes.  HISTORY  Chief Complaint Seizures   HPI Isaac Vaughn is a 30 y.o. malewho presents to the ED for evaluation of seizures  Chart review indicates hx developmental delay, seizure disorder on Keppra and Vimpat. Follows with [facility].   Patient presents to the ED via EMS from home due to witnessed seizure activity.  Reportedly had 2 seizures each lasting about 1 minute this evening.  Patient presents somnolent/confused/postictal and is unable to provide any relevant history due to this and his history of intellectual disability.  Thereby limiting history acquisition.  Mother later comes to the ED and provides additional history.  She had a case that they just moved houses today and patient was moving boxes and sweating for much of the day.  Mother reports adamantly that he has had no missed medications and has been compliant with his antiepileptic regimen.  She reports concern that he overdid it today in the sun while moving boxes.  She reports seeing a typical syndrome of eye twitching that leads up to his seizures at home.  She witnessed a seizure lasting about 2 min before self resolving.  Did not require abortive medications.  Past Medical History:  Diagnosis Date  . Asthma   . Development delay   . Epilepsy (HCC)   . Seizures [facility])     Patient Active Problem List   Diagnosis Date Noted  . Seizure (HCC) 03/13/2020  . Asthma 03/13/2020  . 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 Chest pain 03/13/2020  . Altered mental status 06/07/2016    No past surgical history on file.  Prior to Admission medications   Medication Sig Start Date End Date Taking? Authorizing Provider  albuterol (VENTOLIN HFA) 108  (90 Base) MCG/ACT inhaler Inhale 2 puffs into the lungs every 6 (six) hours as needed for wheezing or shortness of breath.    [provider]  amLODipine (NORVASC) 5 MG tablet Take 1 tablet (5 mg total) by mouth daily. 03/19/20 04/18/20  Gillis Santa, MD  dicyclomine (BENTYL) 20 MG tablet Take 20 mg by mouth every 6 (six) hours as needed for cramping. 04/29/19 03/13/20  [provider]  famotidine (PEPCID) 20 MG tablet Take 1 tablet (20 mg total) by mouth daily. 07/17/19 07/16/20  Phineas Semen, MD  levETIRAcetam (KEPPRA) 100 MG/ML solution Take 12.5 mLs (1,250 mg total) by mouth 2 (two) times daily. 03/18/20 04/17/20  Gillis Santa, MD  Midazolam (NAYZILAM) 5 MG/0.1ML SOLN Place 5 mg into the nose as directed. May repeat in other nostril after 10 minutes if needed    [provider]  montelukast (SINGULAIR) 10 MG tablet Take 1 tablet (10 mg total) by mouth daily. 12/11/17   Joni Reining, PA-C  ondansetron (ZOFRAN-ODT) 4 MG disintegrating tablet Take by mouth. 05/04/19   [provider]  polyethylene glycol (MIRALAX / GLYCOLAX) 17 g packet Take 17 g by mouth 3 (three) times daily. 04/29/19   [provider]  VIMPAT 50 MG TABS tablet Take 4 tablets (200 mg total) by mouth 2 (two) times daily. 03/18/20 04/17/20  Gillis Santa, MD    Allergies Other  Family History  Problem Relation Age of Onset  . Hypertension Mother   . Asthma Mother     Social History  Social History   Tobacco Use  . Smoking status: Never Smoker  . Smokeless tobacco: Never Used  Substance Use Topics  . Alcohol use: No    Alcohol/week: 0.0 standard drinks  . Drug use: No    Review of Systems  Unable to be accurately assessed due to postictal patient. ____________________________________________   PHYSICAL EXAM:  VITAL SIGNS: Vitals:   02/03/21 2315 02/03/21 2337  BP:  136/80  Pulse: 96 90  Resp: 18 16  Temp:    SpO2: 99% 98%     Constitutional: Alert and oriented.   Supine in bed and somnolent/postictal.  He follows simple commands in all 4 extremities without evidence of focal deficit.  No laterality noted.  No seizure activity noted. Eyes: Conjunctivae are normal. PERRL. EOMI. Head: Atraumatic. Nose: No congestion/rhinnorhea. Mouth/Throat: Mucous membranes are moist.  Oropharynx non-erythematous. Neck: No stridor. No cervical spine tenderness to palpation. Cardiovascular: Normal rate, regular rhythm. Grossly normal heart sounds.  Good peripheral circulation. Respiratory: Normal respiratory effort.  No retractions. Lungs CTAB. Gastrointestinal: Soft , nondistended, nontender to palpation. No CVA tenderness. Musculoskeletal: No lower extremity tenderness nor edema.  No joint effusions. No signs of acute trauma. Neurologic:  Normal speech and language. No gross focal neurologic deficits are appreciated. Cranial nerves II through XII intact 5/5 strength and sensation in all 4 extremities Skin:  Skin is warm, dry and intact. No rash noted. Psychiatric: Mood and affect are normal. Speech and behavior are normal.  ____________________________________________   LABS (all labs ordered are listed, but only abnormal results are displayed)  Labs Reviewed  COMPREHENSIVE METABOLIC PANEL - Abnormal; Notable for the following components:      Result Value   Creatinine, Ser 1.27 (*)    All other components within normal limits  CBC WITH DIFFERENTIAL/PLATELET   ____________________________________________  12 Lead EKG  Sinus rhythm, rate of 92 bpm.  Normal axis and intervals.  Stigmata of LVH.  No evidence of acute ischemia. ____________________________________________  RADIOLOGY  ED MD interpretation: CT head reviewed by me without evidence of acute cranial pathology.  Official radiology report(s): No results found.  ____________________________________________   PROCEDURES and INTERVENTIONS  Procedure(s) performed (including Critical  Care):  .1-3 Lead EKG Interpretation Performed by: Delton Prairie, MD Authorized by: Delton Prairie, MD     Interpretation: normal     ECG rate:  90   ECG rate assessment: normal     Rhythm: sinus rhythm     Ectopy: none     Conduction: normal      Medications  levETIRAcetam (KEPPRA) IVPB 1000 mg/100 mL premix (0 mg Intravenous Stopped 02/03/21 2149)    ____________________________________________   MDM / ED COURSE   30 year old male with known epileptic disorder presents to the ED after witnessed seizure, likely due to physical strain while moving houses today, and without evidence of additional acute pathology.  Normal vitals on room air.  Exam demonstrates a post ictal/somnolent patient who has no evidence of neurovascular deficits or acute trauma.  He is in no acute distress.  Follows commands in all 4 extremities.  EKG is nonischemic without evidence of interval changes to suggest a cardiac etiology of possible syncope.  CT head demonstrates no evidence of ICH, mass or CVA.  Blood work was unremarkable.  We will load the patient with a gram of Keppra and observe in the ED until he returns to his clinical baseline before anticipate likely discharge to the care of mother.  Patient signed out to oncoming  provider to facilitate this follow-up  Clinical Course as of 02/04/21 0011  Caleen Essex Feb 03, 2021  2058 Called mom, Larkin Ina.  She is on the way to the hospital now and prefers to speak in person. [DS]  2140 Talked with mom at the bedside. [DS]    Clinical Course User Index [DS] Delton Prairie, MD    ____________________________________________   FINAL CLINICAL IMPRESSION(S) / ED DIAGNOSES  Final diagnoses:  Seizure [facility])     ED Discharge Orders    None       Jakerria Kingbird Katrinka Blazing   Note:  This document was prepared using Dragon voice recognition software and may include unintentional dictation errors.   Delton Prairie, MD 02/04/21 [phone]

## 2021-02-04 ENCOUNTER — Encounter: Payer: Self-pay | Admitting: Emergency Medicine

## 2021-02-04 ENCOUNTER — Emergency Department: Payer: Medicaid Other

## 2021-02-04 ENCOUNTER — Emergency Department
Admission: EM | Admit: 2021-02-04 | Discharge: 2021-02-04 | Disposition: A | Discharge status: Home / Self Care | Payer: Medicaid Other | Source: Home / Self Care | Attending: Emergency Medicine | Admitting: Emergency Medicine

## 2021-02-04 ENCOUNTER — Other Ambulatory Visit: Payer: Self-pay

## 2021-02-04 DIAGNOSIS — R569 Unspecified convulsions: Secondary | ICD-10-CM

## 2021-02-04 DIAGNOSIS — J45909 Unspecified asthma, uncomplicated: Secondary | ICD-10-CM | POA: Insufficient documentation

## 2021-02-04 DIAGNOSIS — Z79899 Other long term (current) drug therapy: Secondary | ICD-10-CM | POA: Insufficient documentation

## 2021-02-04 DIAGNOSIS — G40909 Epilepsy, unspecified, not intractable, without status epilepticus: Secondary | ICD-10-CM | POA: Insufficient documentation

## 2021-02-04 LAB — CBC WITH DIFFERENTIAL/PLATELET
Abs Immature Granulocytes: 0.01 10*3/uL (ref 0.00–0.07)
Basophils Absolute: 0 10*3/uL (ref 0.0–0.1)
Basophils Relative: 1 %
Eosinophils Absolute: 0.2 10*3/uL (ref 0.0–0.5)
Eosinophils Relative: 4 %
HCT: 46.4 % (ref 39.0–52.0)
Hemoglobin: 15.5 g/dL (ref 13.0–17.0)
Immature Granulocytes: 0 %
Lymphocytes Relative: 42 %
Lymphs Abs: 2.5 10*3/uL (ref 0.7–4.0)
MCH: 32 pg (ref 26.0–34.0)
MCHC: 33.4 g/dL (ref 30.0–36.0)
MCV: 95.9 fL (ref 80.0–100.0)
Monocytes Absolute: 0.5 10*3/uL (ref 0.1–1.0)
Monocytes Relative: 9 %
Neutro Abs: 2.7 10*3/uL (ref 1.7–7.7)
Neutrophils Relative %: 44 %
Platelets: 221 10*3/uL (ref 150–400)
RBC: 4.84 MIL/uL (ref 4.22–5.81)
RDW: 12.4 % (ref 11.5–15.5)
WBC: 6 10*3/uL (ref 4.0–10.5)
nRBC: 0 % (ref 0.0–0.2)

## 2021-02-04 LAB — BASIC METABOLIC PANEL
Anion gap: 10 (ref 5–15)
BUN: 12 mg/dL (ref 6–20)
CO2: 23 mmol/L (ref 22–32)
Calcium: 9.2 mg/dL (ref 8.9–10.3)
Chloride: 106 mmol/L (ref 98–111)
Creatinine, Ser: 1.17 mg/dL (ref 0.61–1.24)
GFR, Estimated: >60 mL/min (ref >60)
Glucose, Bld: 116 mg/dL — ABNORMAL HIGH (ref 70–99)
Potassium: 3.9 mmol/L (ref 3.5–5.1)
Sodium: 139 mmol/L (ref 135–145)

## 2021-02-04 MED ORDER — LEVETIRACETAM IN NACL 1000 MG/100ML IV SOLN
1000.0000 mg | Freq: Once | INTRAVENOUS | Status: AC
  Administered 2021-02-04: 1000 mg via INTRAVENOUS

## 2021-02-04 MED ORDER — LEVETIRACETAM 100 MG/ML PO SOLN: 1500.0000 mg | Freq: Two times a day (BID) | ORAL | 0 refills | Status: AC

## 2021-02-04 MED ORDER — NAYZILAM 5 MG/0.1ML NA SOLN: 5.0000 mg | NASAL | 0 refills | Status: AC

## 2021-02-04 NOTE — ED Notes (Signed)
Patient reports pain to the right shoulder since PTA. Patient denies injury or trauma, but states pain was not present prior to seizure. Dr. Katrinka Blazing notified. New order for xray received.

## 2021-02-04 NOTE — ED Notes (Signed)
Patient and mother provided with discharge instructions. Patient denies questions, needs, or concerns. Patient is alert, oriented x4. Patient assisted to mother's car via wheelchair, but ambulatory to the vehicle.

## 2021-02-04 NOTE — ED Notes (Signed)
Patient and mother at bedside updated on POC.

## 2021-02-04 NOTE — Discharge Instructions (Signed)
Increase keppra to 1500mg  twice a day.

## 2021-02-04 NOTE — ED Triage Notes (Signed)
Pt via Ems. Per ems mom states patient had seizure at home that lasted 1-2 minutes. Pt was seen yesterday for same. On arrival patient post ictal  and lethargic.

## 2021-02-04 NOTE — ED Notes (Signed)
CT head and DG shoulder both resulted, but have not crossed over per Dr. York Cerise. Patient ok for discharge.

## 2021-02-04 NOTE — ED Notes (Signed)
Pt drinking coke with no ice MD ok'd

## 2021-02-04 NOTE — ED Notes (Signed)
family wants new anti-seizure script, and should he take meds tonight

## 2021-02-04 NOTE — ED Provider Notes (Signed)
[facility] Emergency Department Provider Note  ____________________________________________  Time seen: Approximately 6:41 PM  I have reviewed the triage vital signs and the nursing notes.   HISTORY  Chief Complaint Seizures    Level 5 Caveat: Portions of the History and Physical including HPI and review of systems are unable to be completely obtained due to patient being a poor historian   HPI Isaac Vaughn is a 30 y.o. male with a history of epilepsy who comes the ED due to a seizure today.  Was in his usual state of health, seizure occurred at approximately 5 PM, lasted for about a minute.  Still postictal on arrival.  No recent trauma or other acute symptoms.  Patient was seen in the ED yesterday for seizure and discharged home.  He had CT scan at that time which was unremarkable.      Past Medical History:  Diagnosis Date  . Asthma   . Development delay   . Epilepsy (HCC)   . Seizures [facility])      Patient Active Problem List   Diagnosis Date Noted  . Seizure (HCC) 03/13/2020  . Asthma 03/13/2020  . 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 Chest pain 03/13/2020  . Altered mental status 06/07/2016     History reviewed. No pertinent surgical history.   Prior to Admission medications   Medication Sig Start Date End Date Taking? Authorizing Provider  albuterol (VENTOLIN HFA) 108 (90 Base) MCG/ACT inhaler Inhale 2 puffs into the lungs every 6 (six) hours as needed for wheezing or shortness of breath.    [provider]  amLODipine (NORVASC) 5 MG tablet Take 1 tablet (5 mg total) by mouth daily. 03/19/20 04/18/20  Gillis Santa, MD  dicyclomine (BENTYL) 20 MG tablet Take 20 mg by mouth every 6 (six) hours as needed for cramping. 04/29/19 03/13/20  [provider]  famotidine (PEPCID) 20 MG tablet Take 1 tablet (20 mg total) by mouth daily. 07/17/19 07/16/20  Phineas Semen, MD  levETIRAcetam (KEPPRA) 100 MG/ML solution Take 15  mLs (1,500 mg total) by mouth 2 (two) times daily. 02/04/21 03/06/21  Sharman Cheek, MD  Midazolam (NAYZILAM) 5 MG/0.1ML SOLN Place 5 mg into the nose as directed. May repeat in other nostril after 10 minutes if needed 02/04/21   Sharman Cheek, MD  montelukast (SINGULAIR) 10 MG tablet Take 1 tablet (10 mg total) by mouth daily. 12/11/17   Joni Reining, PA-C  ondansetron (ZOFRAN-ODT) 4 MG disintegrating tablet Take by mouth. 05/04/19   [provider]  polyethylene glycol (MIRALAX / GLYCOLAX) 17 g packet Take 17 g by mouth 3 (three) times daily. 04/29/19   [provider]  VIMPAT 50 MG TABS tablet Take 4 tablets (200 mg total) by mouth 2 (two) times daily. 03/18/20 04/17/20  Gillis Santa, MD     Allergies Other   Family History  Problem Relation Age of Onset  . Hypertension Mother   . Asthma Mother     Social History Social History   Tobacco Use  . Smoking status: Never Smoker  . Smokeless tobacco: Never Used  Substance Use Topics  . Alcohol use: No    Alcohol/week: 0.0 standard drinks  . Drug use: No    Review of Systems Level 5 Caveat: Portions of the History and Physical including HPI and review of systems are unable to be completely obtained due to patient being a poor historian   Constitutional:   No known fever.  ENT:  No rhinorrhea. Cardiovascular:   No chest pain or syncope. Respiratory:   No dyspnea or cough. Gastrointestinal:   Negative for abdominal pain, vomiting and diarrhea.  Musculoskeletal:   Negative for focal pain or swelling ____________________________________________   PHYSICAL EXAM:  VITAL SIGNS: ED Triage Vitals  Enc Vitals Group     BP 02/04/21 1739 (!) 138/91     Pulse Rate 02/04/21 1739 76     Resp --      Temp 02/04/21 1739 98.6 F (37 C)     Temp Source 02/04/21 1739 Axillary     SpO2 02/04/21 1738 99 %     Weight 02/04/21 1740 299 lb 13.2 oz (136 kg)     Height 02/04/21 1740 6' (1.829 m)     Head Circumference --       Peak Flow --      Pain Score 02/04/21 1740 0     Pain Loc --      Pain Edu? --      Excl. in GC? --     Vital signs reviewed, nursing assessments reviewed.   Constitutional:   Somnolent.  Non-toxic appearance. Eyes:   Conjunctivae are normal. EOMI. PERRL. ENT      Head:   Normocephalic and atraumatic.      Nose:   No congestion/rhinnorhea.       Mouth/Throat:   MMM, no pharyngeal erythema. No peritonsillar mass.  No tongue injury      Neck:   No meningismus. Full ROM. Hematological/Lymphatic/Immunilogical:   No cervical lymphadenopathy. Cardiovascular:   RRR. Symmetric bilateral radial and DP pulses.  No murmurs. Cap refill less than 2 seconds. Respiratory:   Normal respiratory effort without tachypnea/retractions. Breath sounds are clear and equal bilaterally. No wheezes/rales/rhonchi. Gastrointestinal:   Soft and nontender. Non distended. There is no CVA tenderness.  No rebound, rigidity, or guarding.  Musculoskeletal:   Normal range of motion in all extremities. No joint effusions.  No lower extremity tenderness.  No edema. Neurologic:   Somnolent, maintaining airway.  Able to follow basic commands inconsistently. No focal deficits. Skin:    Skin is warm, dry and intact. No rash noted.  No petechiae, purpura, or bullae.  ____________________________________________    LABS (pertinent positives/negatives) (all labs ordered are listed, but only abnormal results are displayed) Labs Reviewed  BASIC METABOLIC PANEL - Abnormal; Notable for the following components:      Result Value   Glucose, Bld 116 (*)    All other components within normal limits  CBC WITH DIFFERENTIAL/PLATELET   ____________________________________________   EKG    ____________________________________________    RADIOLOGY  DG Shoulder Right Portable  Result Date: 02/04/2021 CLINICAL DATA:  Initial evaluation for acute pain status post seizure. EXAM: PORTABLE RIGHT SHOULDER COMPARISON:  None.  FINDINGS: There is no evidence of fracture or dislocation. There is no evidence of arthropathy or other focal bone abnormality. Soft tissues are unremarkable. IMPRESSION: No acute osseous abnormality about the right shoulder. Electronically Signed   By: Rise Mu M.D.   On: 02/04/2021 01:22    ____________________________________________   PROCEDURES Procedures  ____________________________________________    CLINICAL IMPRESSION / ASSESSMENT AND PLAN / ED COURSE  Medications ordered in the ED: Medications  levETIRAcetam (KEPPRA) IVPB 1000 mg/100 mL premix (0 mg Intravenous Stopped 02/04/21 1927)    Pertinent labs & imaging results that were available during my care of the patient were reviewed by me and considered in my medical decision making (see chart for details).  RAINEY KAHRS was evaluated in Emergency Department on 02/04/2021 for the symptoms described in the history of present illness. He was evaluated in the context of the global COVID-19 pandemic, which necessitated consideration that the patient might be at risk for infection with the SARS-CoV-2 virus that causes COVID-19. Institutional protocols and algorithms that pertain to the evaluation of patients at risk for COVID-19 are in a state of rapid change based on information released by regulatory bodies including the CDC and federal and state organizations. These policies and algorithms were followed during the patient's care in the ED.   Patient presents with seizure today in the setting of known epilepsy.  Still postictal.  Vital signs unremarkable, maintaining airway.  Reportedly has been compliant with his medications.  Labs normal.  Will monitor in the ED until alert.  Will give additional IV Keppra.   ----------------------------------------- 9:32 PM on 02/04/2021 -----------------------------------------  Back to normal, vital signs normal, mental status normal, discussed with caregiver at bedside.   Provided refill for intranasal midazolam to use for seizures.  Provided new prescription to increase Keppra to 1500 twice daily.     ____________________________________________   FINAL CLINICAL IMPRESSION(S) / ED DIAGNOSES    Final diagnoses:  Seizure [facility])     ED Discharge Orders         Ordered    Midazolam (NAYZILAM) 5 MG/0.1ML SOLN  As directed        02/04/21 2123    levETIRAcetam (KEPPRA) 100 MG/ML solution  2 times daily        02/04/21 2123          Portions of this note were generated with dragon dictation software. Dictation errors may occur despite best attempts at proofreading.   Sharman Cheek, MD 02/04/21 2133

## 2021-02-04 NOTE — Discharge Instructions (Addendum)
You have been seen in the emergency department today for a seizure.  Your workup today including labs are within normal limits.  Please follow up with your doctor/neurologist as soon as possible regarding today's emergency department visit and your likely seizure.  You may also take over-the-counter ibuprofen and/or Tylenol according to label instructions as needed for your shoulder pain.  The x-rays were reassuring and did not show any sign of fracture nor dislocation.  As we have discussed it is very important that you do not drive until you have been seen and cleared by your neurologist.  Please drink plenty of fluids, get plenty of sleep and avoid any alcohol or drug use.  Please return to the emergency department if you have any further seizures which do not respond to medications, or for any other symptoms per se concerning for yourself.

## 2021-03-16 ENCOUNTER — Other Ambulatory Visit: Payer: Self-pay

## 2021-03-16 ENCOUNTER — Emergency Department
Admission: EM | Admit: 2021-03-16 | Discharge: 2021-03-16 | Disposition: A | Discharge status: Home / Self Care | Payer: Medicaid Other | Attending: Emergency Medicine | Admitting: Emergency Medicine

## 2021-03-16 DIAGNOSIS — G40909 Epilepsy, unspecified, not intractable, without status epilepticus: Secondary | ICD-10-CM | POA: Diagnosis not present

## 2021-03-16 DIAGNOSIS — R569 Unspecified convulsions: Secondary | ICD-10-CM

## 2021-03-16 DIAGNOSIS — J45909 Unspecified asthma, uncomplicated: Secondary | ICD-10-CM | POA: Insufficient documentation

## 2021-03-16 LAB — CBG MONITORING, ED: Glucose-Capillary: 89 mg/dL (ref 70–99)

## 2021-03-16 MED ORDER — LEVETIRACETAM IN NACL 1000 MG/100ML IV SOLN
1000.0000 mg | Freq: Once | INTRAVENOUS | Status: AC
  Administered 2021-03-16: 1000 mg via INTRAVENOUS
  Filled 2021-03-16: qty 100

## 2021-03-16 MED ORDER — SODIUM CHLORIDE 0.9 % IV SOLN
200.0000 mg | INTRAVENOUS | Status: AC
  Administered 2021-03-16: 200 mg via INTRAVENOUS
  Filled 2021-03-16: qty 20

## 2021-03-16 NOTE — ED Triage Notes (Signed)
Patient presents to the ED with complaints of seizure today. EMS report the seizure was witnessed by family. EMS report the patient did take his Keppra today. Patient arrives with IV to left hand by EMS. Patient appears post-ictal, but is responding to commands.

## 2021-03-16 NOTE — ED Notes (Signed)
E signature pad malfunction. Mother verbalized understanding of discharge instructions.

## 2021-03-16 NOTE — ED Provider Notes (Signed)
[facility] Emergency Department Provider Note  ____________________________________________  Time seen: Approximately 10:41 PM  I have reviewed the triage vital signs and the nursing notes.   HISTORY  Chief Complaint Seizures    Level 5 Caveat: Portions of the History and Physical including HPI and review of systems are unable to be completely obtained due to patient altered mental status  HPI Isaac Vaughn is a 29 y.o. male with a history of epilepsy who was brought to the ED after a seizure today.   Mother reports that patient did not take his medicines yesterday because a friend was visiting and he was out of the house all day.  He takes Vimpat and Keppra every day.  He has had them this morning.  This afternoon he had a seizure at home which resolved spontaneously.  EMS was called, and while transporting him to the ED he had another 32nd seizure that resolved on its own without requiring any benzodiazepines.  Patient is currently postictal  Mom reports otherwise baseline state of health, no acute symptoms or trauma.  No fever.    Past Medical History:  Diagnosis Date  . Asthma   . Development delay   . Epilepsy (HCC)   . Seizures [facility])      Patient Active Problem List   Diagnosis Date Noted  . Seizure (HCC) 03/13/2020  . Asthma 03/13/2020  . 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 Chest pain 03/13/2020  . Altered mental status 06/07/2016     History reviewed. No pertinent surgical history.   Prior to Admission medications   Medication Sig Start Date End Date Taking? Authorizing Provider  albuterol (VENTOLIN HFA) 108 (90 Base) MCG/ACT inhaler Inhale 2 puffs into the lungs every 6 (six) hours as needed for wheezing or shortness of breath.    [provider]  amLODipine (NORVASC) 5 MG tablet Take 1 tablet (5 mg total) by mouth daily. 03/19/20 04/18/20  Gillis Santa, MD  dicyclomine (BENTYL) 20 MG tablet Take 20 mg by mouth  every 6 (six) hours as needed for cramping. 04/29/19 03/13/20  [provider]  famotidine (PEPCID) 20 MG tablet Take 1 tablet (20 mg total) by mouth daily. 07/17/19 07/16/20  Phineas Semen, MD  levETIRAcetam (KEPPRA) 100 MG/ML solution Take 15 mLs (1,500 mg total) by mouth 2 (two) times daily. 02/04/21 03/06/21  Sharman Cheek, MD  Midazolam (NAYZILAM) 5 MG/0.1ML SOLN Place 5 mg into the nose as directed. May repeat in other nostril after 10 minutes if needed 02/04/21   Sharman Cheek, MD  montelukast (SINGULAIR) 10 MG tablet Take 1 tablet (10 mg total) by mouth daily. 12/11/17   Joni Reining, PA-C  ondansetron (ZOFRAN-ODT) 4 MG disintegrating tablet Take by mouth. 05/04/19   [provider]  polyethylene glycol (MIRALAX / GLYCOLAX) 17 g packet Take 17 g by mouth 3 (three) times daily. 04/29/19   [provider]  VIMPAT 50 MG TABS tablet Take 4 tablets (200 mg total) by mouth 2 (two) times daily. 03/18/20 04/17/20  Gillis Santa, MD     Allergies Other   Family History  Problem Relation Age of Onset  . Hypertension Mother   . Asthma Mother     Social History Social History   Tobacco Use  . Smoking status: Never Smoker  . Smokeless tobacco: Never Used  Substance Use Topics  . Alcohol use: No    Alcohol/week: 0.0 standard drinks  . Drug use: No    Review of Systems  Level 5 Caveat: Portions of the History and Physical including HPI and review of systems are unable to be completely obtained due to patient being a poor historian   Constitutional:   No known fever.  ENT:   No rhinorrhea. Cardiovascular:   No chest pain or syncope. Respiratory:   No dyspnea or cough. Gastrointestinal:   Negative for abdominal pain, vomiting and diarrhea.  Musculoskeletal:   Negative for focal pain or swelling ____________________________________________   PHYSICAL EXAM:  VITAL SIGNS: ED Triage Vitals  Enc Vitals Group     BP 03/16/21 1815 (!) 138/93     Pulse Rate  03/16/21 1815 70     Resp 03/16/21 1815 18     Temp 03/16/21 1815 98.4 F (36.9 C)     Temp Source 03/16/21 1815 Oral     SpO2 03/16/21 1815 100 %     Weight 03/16/21 1857 (!) 308 lb 10.3 oz (140 kg)     Height 03/16/21 1857 6' (1.829 m)     Head Circumference --      Peak Flow --      Pain Score 03/16/21 2000 0     Pain Loc --      Pain Edu? --      Excl. in GC? --     Vital signs reviewed, nursing assessments reviewed.   Constitutional:   Alert and oriented. Non-toxic appearance. Eyes:   Conjunctivae are normal. EOMI. PERRL.  No nystagmus ENT      Head:   Normocephalic and atraumatic.      Nose:   No congestion/rhinnorhea.       Mouth/Throat:   MMM, no pharyngeal erythema. No peritonsillar mass.       Neck:   No meningismus. Full ROM. Hematological/Lymphatic/Immunilogical:   No cervical lymphadenopathy. Cardiovascular:   RRR. Symmetric bilateral radial and DP pulses.  No murmurs. Cap refill less than 2 seconds. Respiratory:   Normal respiratory effort without tachypnea/retractions. Breath sounds are clear and equal bilaterally. No wheezes/rales/rhonchi. Gastrointestinal:   Soft and nontender. Non distended. There is no CVA tenderness.  No rebound, rigidity, or guarding. Genitourinary:   deferred Musculoskeletal:   Normal range of motion in all extremities. No joint effusions.  No lower extremity tenderness.  No edema. Neurologic:   Postictal Motor grossly intact. No acute focal neurologic deficits are appreciated.  Skin:    Skin is warm, dry and intact. No rash noted.  No petechiae, purpura, or bullae.  ____________________________________________    LABS (pertinent positives/negatives) (all labs ordered are listed, but only abnormal results are displayed) Labs Reviewed  CBG MONITORING, ED   ____________________________________________   EKG    ____________________________________________    RADIOLOGY  No results  found.  ____________________________________________   PROCEDURES Procedures  ____________________________________________    CLINICAL IMPRESSION / ASSESSMENT AND PLAN / ED COURSE  Medications ordered in the ED: Medications  levETIRAcetam (KEPPRA) IVPB 1000 mg/100 mL premix (0 mg Intravenous Stopped 03/16/21 1908)  lacosamide (VIMPAT) 200 mg in sodium chloride 0.9 % 25 mL IVPB (0 mg Intravenous Stopped 03/16/21 2008)    Pertinent labs & imaging results that were available during my care of the patient were reviewed by me and considered in my medical decision making (see chart for details).   Isaac Vaughn was evaluated in Emergency Department on 03/16/2021 for the symptoms described in the history of present illness. He was evaluated in the context of the global COVID-19 pandemic, which necessitated consideration that the patient might be  at risk for infection with the SARS-CoV-2 virus that causes COVID-19. Institutional protocols and algorithms that pertain to the evaluation of patients at risk for COVID-19 are in a state of rapid change based on information released by regulatory bodies including the CDC and federal and state organizations. These policies and algorithms were followed during the patient's care in the ED.   Patient brought to ED after seizure related to medication noncompliance.  He did have his morning dose of medications.  Patient given additional IV Keppra bolus and IV Vimpat.  On reassessment at 10:00 PM, patient is back to normal mental status according to mother at bedside.  Advised her to go ahead and give nighttime dose of Keppra when they get home, no additional Vimpat tonight and then usual schedule tomorrow.  Can follow-up with his usual doctors.  No acute symptoms to suggest any other acute pathology.  Doubt intracranial hemorrhage, stroke, meningitis, encephalitis, electrolyte abnormality.      ____________________________________________   FINAL  CLINICAL IMPRESSION(S) / ED DIAGNOSES    Final diagnoses:  Seizure [facility])     ED Discharge Orders    None      Portions of this note were generated with dragon dictation software. Dictation errors may occur despite best attempts at proofreading.   Sharman Cheek, MD 03/16/21 2315

## 2021-03-16 NOTE — ED Notes (Signed)
PO challenge - patient tolerated fluids, is now alert, oriented x4.

## 2021-03-16 NOTE — ED Notes (Signed)
Awaiting Vimpat from pharmacy. Mother at bedside.

## 2021-04-27 ENCOUNTER — Emergency Department
Admission: EM | Admit: 2021-04-27 | Discharge: 2021-04-27 | Disposition: A | Discharge status: Home / Self Care | Payer: Medicaid Other | Attending: Emergency Medicine | Admitting: Emergency Medicine

## 2021-04-27 DIAGNOSIS — J45909 Unspecified asthma, uncomplicated: Secondary | ICD-10-CM | POA: Insufficient documentation

## 2021-04-27 DIAGNOSIS — R569 Unspecified convulsions: Secondary | ICD-10-CM | POA: Diagnosis present

## 2021-04-27 DIAGNOSIS — Z79899 Other long term (current) drug therapy: Secondary | ICD-10-CM | POA: Diagnosis not present

## 2021-04-27 DIAGNOSIS — G40909 Epilepsy, unspecified, not intractable, without status epilepticus: Secondary | ICD-10-CM | POA: Diagnosis not present

## 2021-04-27 LAB — CBC WITH DIFFERENTIAL/PLATELET
Abs Immature Granulocytes: 0.01 10*3/uL (ref 0.00–0.07)
Basophils Absolute: 0 10*3/uL (ref 0.0–0.1)
Basophils Relative: 1 %
Eosinophils Absolute: 0.1 10*3/uL (ref 0.0–0.5)
Eosinophils Relative: 2 %
HCT: 44.6 % (ref 39.0–52.0)
Hemoglobin: 15.6 g/dL (ref 13.0–17.0)
Immature Granulocytes: 0 %
Lymphocytes Relative: 40 %
Lymphs Abs: 2.3 10*3/uL (ref 0.7–4.0)
MCH: 32.1 pg (ref 26.0–34.0)
MCHC: 35 g/dL (ref 30.0–36.0)
MCV: 91.8 fL (ref 80.0–100.0)
Monocytes Absolute: 0.4 10*3/uL (ref 0.1–1.0)
Monocytes Relative: 8 %
Neutro Abs: 2.8 10*3/uL (ref 1.7–7.7)
Neutrophils Relative %: 49 %
Platelets: 220 10*3/uL (ref 150–400)
RBC: 4.86 MIL/uL (ref 4.22–5.81)
RDW: 12.2 % (ref 11.5–15.5)
WBC: 5.7 10*3/uL (ref 4.0–10.5)
nRBC: 0 % (ref 0.0–0.2)

## 2021-04-27 LAB — URINALYSIS, COMPLETE (UACMP) WITH MICROSCOPIC
Bacteria, UA: NONE SEEN
Bilirubin Urine: NEGATIVE
Glucose, UA: 50 mg/dL — AB
Ketones, ur: NEGATIVE mg/dL
Leukocytes,Ua: NEGATIVE
Nitrite: NEGATIVE
Protein, ur: NEGATIVE mg/dL
Specific Gravity, Urine: 1.008 (ref 1.005–1.030)
Squamous Epithelial / HPF: NONE SEEN (ref 0–5)
pH: 6 (ref 5.0–8.0)

## 2021-04-27 LAB — COMPREHENSIVE METABOLIC PANEL
ALT: 23 U/L (ref 0–44)
AST: 21 U/L (ref 15–41)
Albumin: 4.1 g/dL (ref 3.5–5.0)
Alkaline Phosphatase: 56 U/L (ref 38–126)
Anion gap: 8 (ref 5–15)
BUN: 13 mg/dL (ref 6–20)
CO2: 26 mmol/L (ref 22–32)
Calcium: 9 mg/dL (ref 8.9–10.3)
Chloride: 105 mmol/L (ref 98–111)
Creatinine, Ser: 1.21 mg/dL (ref 0.61–1.24)
GFR, Estimated: >60 mL/min (ref >60)
Glucose, Bld: 96 mg/dL (ref 70–99)
Potassium: 3.7 mmol/L (ref 3.5–5.1)
Sodium: 139 mmol/L (ref 135–145)
Total Bilirubin: 0.7 mg/dL (ref 0.3–1.2)
Total Protein: 7.4 g/dL (ref 6.5–8.1)

## 2021-04-27 LAB — CBG MONITORING, ED: Glucose-Capillary: 101 mg/dL — ABNORMAL HIGH (ref 70–99)

## 2021-04-27 MED ORDER — LEVETIRACETAM IN NACL 1500 MG/100ML IV SOLN
1500.0000 mg | Freq: Once | INTRAVENOUS | Status: AC
  Administered 2021-04-27: 1500 mg via INTRAVENOUS
  Filled 2021-04-27: qty 100

## 2021-04-27 NOTE — ED Notes (Signed)
Unable to answer complete triage d/t pt not verbally responding at this time.

## 2021-04-27 NOTE — ED Notes (Signed)
[id number]

## 2021-04-27 NOTE — ED Provider Notes (Addendum)
[facility] Emergency Department Provider Note   ____________________________________________   Event Date/Time   First MD Initiated Contact with Patient 04/27/21 0254     (approximate)  I have reviewed the triage vital signs and the nursing notes.   HISTORY  Chief Complaint Seizures  Level of V caveat: Limited by nonverbal status  HPI Isaac Vaughn is a 30 y.o. male brought to the ED via EMS from home for seizure-like activity.  Patient has a history of seizure disorder, on Keppra and Vimpat.  Mother called EMS for seizure-like activity.  EMS reports patient seen to have shaking but responding during the episode.  Appears postictal on arrival being nonverbal but tracking this examiner and following simple commands.  Rest of history is limited by patient's nonverbal status; awaiting mother's arrival for further history.     Past Medical History:  Diagnosis Date  . Asthma   . Development delay   . Epilepsy (HCC)   . Seizures [facility])     Patient Active Problem List   Diagnosis Date Noted  . Seizure (HCC) 03/13/2020  . Asthma 03/13/2020  . 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 Chest pain 03/13/2020  . Altered mental status 06/07/2016    History reviewed. No pertinent surgical history.  Prior to Admission medications   Medication Sig Start Date End Date Taking? Authorizing Provider  albuterol (VENTOLIN HFA) 108 (90 Base) MCG/ACT inhaler Inhale 2 puffs into the lungs every 6 (six) hours as needed for wheezing or shortness of breath.    [provider]  amLODipine (NORVASC) 5 MG tablet Take 1 tablet (5 mg total) by mouth daily. 03/19/20 04/18/20  Gillis Santa, MD  dicyclomine (BENTYL) 20 MG tablet Take 20 mg by mouth every 6 (six) hours as needed for cramping. 04/29/19 03/13/20  [provider]  famotidine (PEPCID) 20 MG tablet Take 1 tablet (20 mg total) by mouth daily. 07/17/19 07/16/20  Phineas Semen, MD  levETIRAcetam  (KEPPRA) 100 MG/ML solution Take 15 mLs (1,500 mg total) by mouth 2 (two) times daily. 02/04/21 03/06/21  Sharman Cheek, MD  Midazolam (NAYZILAM) 5 MG/0.1ML SOLN Place 5 mg into the nose as directed. May repeat in other nostril after 10 minutes if needed 02/04/21   Sharman Cheek, MD  montelukast (SINGULAIR) 10 MG tablet Take 1 tablet (10 mg total) by mouth daily. 12/11/17   Joni Reining, PA-C  ondansetron (ZOFRAN-ODT) 4 MG disintegrating tablet Take by mouth. 05/04/19   [provider]  polyethylene glycol (MIRALAX / GLYCOLAX) 17 g packet Take 17 g by mouth 3 (three) times daily. 04/29/19   [provider]  VIMPAT 50 MG TABS tablet Take 4 tablets (200 mg total) by mouth 2 (two) times daily. 03/18/20 04/17/20  Gillis Santa, MD    Allergies Other  Family History  Problem Relation Age of Onset  . Hypertension Mother   . Asthma Mother     Social History Social History   Tobacco Use  . Smoking status: Never Smoker  . Smokeless tobacco: Never Used  Substance Use Topics  . Alcohol use: No    Alcohol/week: 0.0 standard drinks  . Drug use: No    Review of Systems  Constitutional: No fever/chills Eyes: No visual changes. ENT: No sore throat. Cardiovascular: Denies chest pain. Respiratory: Denies shortness of breath. Gastrointestinal: No abdominal pain.  No nausea, no vomiting.  No diarrhea.  No constipation. Genitourinary: Negative for dysuria. Musculoskeletal: Negative for back pain. Skin: Negative for rash. Neurological:  Positive for seizure.  Negative for headaches, focal weakness or numbness.   ____________________________________________   PHYSICAL EXAM:  VITAL SIGNS: ED Triage Vitals  Enc Vitals Group     BP 04/27/21 0253 (!) 144/90     Pulse Rate 04/27/21 0253 80     Resp 04/27/21 0253 (!) 22     Temp 04/27/21 0253 98.9 F (37.2 C)     Temp Source 04/27/21 0253 Oral     SpO2 04/27/21 0253 99 %     Weight 04/27/21 0255 (!) 316 lb 6.4 oz (143.5  kg)     Height 04/27/21 0255 6' (1.829 m)     Head Circumference --      Peak Flow --      Pain Score --      Pain Loc --      Pain Edu? --      Excl. in GC? --     Constitutional: Alert and oriented. Well appearing and in no acute distress. Eyes: Conjunctivae are normal. PERRL. EOMI. Head: Atraumatic. Nose: No congestion/rhinnorhea. Mouth/Throat: Mucous membranes are moist.  Unable to protrude tongue to examine for laceration. Neck: No stridor.   Cardiovascular: Normal rate, regular rhythm. Grossly normal heart sounds.  Good peripheral circulation. Respiratory: Normal respiratory effort.  No retractions. Lungs CTAB. Gastrointestinal: Soft and nontender to light or deep palpation. No distention. No abdominal bruits. No CVA tenderness.  No urinary incontinence. Musculoskeletal: No lower extremity tenderness nor edema.  No joint effusions. Neurologic: Nonverbal. No gross focal neurologic deficits are appreciated. MAEx4. Follow simple commands.   Skin:  Skin is warm, dry and intact. No rash noted. Psychiatric: Mood and affect are normal. Speech and behavior are normal.  ____________________________________________   LABS (all labs ordered are listed, but only abnormal results are displayed)  Labs Reviewed  URINALYSIS, COMPLETE (UACMP) WITH MICROSCOPIC - Abnormal; Notable for the following components:      Result Value   Color, Urine STRAW (*)    APPearance CLEAR (*)    Glucose, UA 50 (*)    Hgb urine dipstick SMALL (*)    All other components within normal limits  CBG MONITORING, ED - Abnormal; Notable for the following components:   Glucose-Capillary 101 (*)    All other components within normal limits  CBC WITH DIFFERENTIAL/PLATELET  COMPREHENSIVE METABOLIC PANEL   ____________________________________________  EKG  ED ECG REPORT I, Kelso Bibby J, the attending physician, personally viewed and interpreted this ECG.   Date: 04/27/2021  EKG Time: 0254  Rate: 80  Rhythm:  normal EKG, normal sinus rhythm  Axis: Normal  Intervals:none  ST&T Change: Nonspecific  ____________________________________________  RADIOLOGY I, Kiarah Eckstein J, personally viewed and evaluated these images (plain radiographs) as part of my medical decision making, as well as reviewing the written report by the radiologist.  ED MD interpretation: None  Official radiology report(s): No results found.  ____________________________________________   PROCEDURES  Procedure(s) performed (including Critical Care):  .1-3 Lead EKG Interpretation Performed by: Irean Hong, MD Authorized by: Irean Hong, MD     Interpretation: normal     ECG rate:  80   ECG rate assessment: normal     Rhythm: sinus rhythm     Ectopy: none     Conduction: normal   Comments:     Patient placed on cardiac monitor to evaluate for arrhythmias     ____________________________________________   INITIAL IMPRESSION / ASSESSMENT AND PLAN / ED COURSE  As part of my medical decision  making, I reviewed the following data within the electronic MEDICAL RECORD NUMBER History obtained from family, Nursing notes reviewed and incorporated, Labs reviewed, EKG interpreted, Old chart reviewed and Notes from prior ED visits     30 year old male with seizure disorder presenting with seizure.  Differential diagnosis includes but is not limited to subtherapeutic AED levels, infectious, metabolic etiologies, etc.  Will obtain basic blood work, load IV Keppra.  Awaiting mother's arrival to obtain further history.  Clinical Course as of 04/27/21 0523  Thu Apr 27, 2021  7026 Patient now conversant, awake and alert.  Mother at bedside.  States she forgot and was late giving him his medications tonight.  States patient is back to his baseline.  Able to stick his tongue out and I see there are no lacerations.  Updated them of all test results.  Strict return precautions given.  Both verbalized understanding agree with plan of  care. [JS]    Clinical Course User Index [JS] Irean Hong, MD     ____________________________________________   FINAL CLINICAL IMPRESSION(S) / ED DIAGNOSES  Final diagnoses:  Seizure [facility])     ED Discharge Orders    None      *Please note:  Isaac Vaughn was evaluated in Emergency Department on 04/27/2021 for the symptoms described in the history of present illness. He was evaluated in the context of the global COVID-19 pandemic, which necessitated consideration that the patient might be at risk for infection with the SARS-CoV-2 virus that causes COVID-19. Institutional protocols and algorithms that pertain to the evaluation of patients at risk for COVID-19 are in a state of rapid change based on information released by regulatory bodies including the CDC and federal and state organizations. These policies and algorithms were followed during the patient's care in the ED.  Some ED evaluations and interventions may be delayed as a result of limited staffing during and the pandemic.*   Note:  This document was prepared using Dragon voice recognition software and may include unintentional dictation errors.   Irean Hong, MD 04/27/21 3785    Irean Hong, MD 04/27/21 [phone]

## 2021-04-27 NOTE — Discharge Instructions (Addendum)
Continue your seizure medications as directed by your doctor.  Do not drive until cleared by your doctor.  Return to the ER for recurrent or worsening symptoms, persistent vomiting, difficulty breathing or other concerns.

## 2021-04-27 NOTE — ED Triage Notes (Signed)
Pt to ED via EMS from home, EMS reports pt's mother called EMS for seizure like activity. Per EMS, pt seen to be having "shaking" but able to respond during event. Pt not speaking on arrival to ED however following commands for vitals and tracking staff with his eyes. Pt's mother en route to ED per EMS. BGL 113 for EMS

## 2021-06-03 ENCOUNTER — Other Ambulatory Visit: Payer: Self-pay

## 2021-06-03 ENCOUNTER — Emergency Department
Admission: EM | Admit: 2021-06-03 | Discharge: 2021-06-03 | Disposition: A | Discharge status: Home / Self Care | Payer: Medicaid Other | Attending: Student in an Organized Health Care Education/Training Program | Admitting: Student in an Organized Health Care Education/Training Program

## 2021-06-03 DIAGNOSIS — J45909 Unspecified asthma, uncomplicated: Secondary | ICD-10-CM | POA: Insufficient documentation

## 2021-06-03 DIAGNOSIS — T50901A Poisoning by unspecified drugs, medicaments and biological substances, accidental (unintentional), initial encounter: Secondary | ICD-10-CM

## 2021-06-03 DIAGNOSIS — F88 Other disorders of psychological development: Secondary | ICD-10-CM | POA: Diagnosis not present

## 2021-06-03 DIAGNOSIS — T426X1A Poisoning by other antiepileptic and sedative-hypnotic drugs, accidental (unintentional), initial encounter: Secondary | ICD-10-CM | POA: Insufficient documentation

## 2021-06-03 NOTE — Discharge Instructions (Addendum)
Unless you are feeling very dizzy, excessively tired, or vomiting take your evening dose. Make sure it is 12 hours from when you took your medication this morning.  For any additional symptoms of concern, return to the ER.

## 2021-06-03 NOTE — ED Triage Notes (Signed)
Pt to ED with mother for accidentally taking 2 pills of vimpat instead of 1 vimpat this morning. Next vimpat dose scheduled for this evening Denies complaints

## 2021-06-03 NOTE — ED Provider Notes (Signed)
[facility] Emergency Department Provider Note  ____________________________________________  Time seen: Approximately 12:13 PM  I have reviewed the triage vital signs and the nursing notes.   HISTORY  Chief Complaint medication error   HPI Isaac Vaughn is a 30 y.o. male who presents to the emergency department after accidentally taking an extra Vimpat today around 11am. He denies symptoms of concern, but wasn't sure if he would be harmed from the extra dose and wasn't sure if he should take the evening dose.   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  Prior to Admission medications   Medication Sig Start Date End Date Taking? Authorizing Provider  albuterol (VENTOLIN HFA) 108 (90 Base) MCG/ACT inhaler Inhale 2 puffs into the lungs every 6 (six) hours as needed for wheezing or shortness of breath.    [provider]  amLODipine (NORVASC) 5 MG tablet Take 1 tablet (5 mg total) by mouth daily. 03/19/20 04/18/20  Gillis Santa, MD  dicyclomine (BENTYL) 20 MG tablet Take 20 mg by mouth every 6 (six) hours as needed for cramping. 04/29/19 03/13/20  [provider]  famotidine (PEPCID) 20 MG tablet Take 1 tablet (20 mg total) by mouth daily. 07/17/19 07/16/20  Phineas Semen, MD  levETIRAcetam (KEPPRA) 100 MG/ML solution Take 15 mLs (1,500 mg total) by mouth 2 (two) times daily. 02/04/21 03/06/21  Sharman Cheek, MD  Midazolam (NAYZILAM) 5 MG/0.1ML SOLN Place 5 mg into the nose as directed. May repeat in other nostril after 10 minutes if needed 02/04/21   Sharman Cheek, MD  montelukast (SINGULAIR) 10 MG tablet Take 1 tablet (10 mg total)  by mouth daily. 12/11/17   Joni Reining, PA-C  ondansetron (ZOFRAN-ODT) 4 MG disintegrating tablet Take by mouth. 05/04/19   [provider]  polyethylene glycol (MIRALAX / GLYCOLAX) 17 g packet Take 17 g by mouth 3 (three) times daily. 04/29/19   [provider]  VIMPAT 50 MG TABS tablet Take 4 tablets (200 mg total) by mouth 2 (two) times daily. 03/18/20 04/17/20  Gillis Santa, MD    Allergies Other  Family History  Problem Relation Age of Onset   Hypertension Mother    Asthma Mother     Social History Social History   Tobacco Use   Smoking status: Never   Smokeless tobacco: Never  Substance Use Topics   Alcohol use: No    Alcohol/week: 0.0 standard drinks   Drug use: No    Review of Systems Constitutional: Negative for fever, dizziness, weakness. ENT: Negative for sore throat. Respiratory: Negative for shortness of breath. Gastrointestinal: No abdominal pain.  No nausea, no vomiting.  No diarrhea.  Musculoskeletal: Negative for generalized body aches. Skin: Negative for rash/lesion/wound. Neurological: Negative for headaches, focal weakness or numbness.  ____________________________________________   PHYSICAL EXAM:  VITAL SIGNS: ED Triage Vitals [06/03/21 1133]  Enc Vitals Group     BP (!) 131/92     Pulse Rate 71     Resp 18     Temp 97.6 F (36.4 C)     Temp Source Oral     SpO2 99 %  Weight (!) 301 lb 4.8 oz (136.7 kg)     Height 6' (1.829 m)     Head Circumference      Peak Flow      Pain Score 0     Pain Loc      Pain Edu?      Excl. in GC?     Constitutional: Alert and oriented. Well appearing and in no acute distress. Eyes: Conjunctivae are normal. PERRL. EOMI. Head: Atraumatic. Nose: No congestion/rhinnorhea. Mouth/Throat: Mucous membranes are moist. Neck: No stridor.  Cardiovascular: Normal rate, regular rhythm. Good peripheral circulation. Respiratory: Normal respiratory effort. Musculoskeletal: Full ROM throughout.   Neurologic:  Normal speech and language. No gross focal neurologic deficits are appreciated. Speech is normal. No gait instability. Skin:  Skin is warm, dry and intact. No rash noted. Psychiatric: Mood and affect are normal. Speech and behavior are normal.  ____________________________________________   LABS (all labs ordered are listed, but only abnormal results are displayed)  Labs Reviewed - No data to display ____________________________________________  EKG  Not indicated. ____________________________________________  RADIOLOGY  Not indicated. ____________________________________________   PROCEDURES  None ____________________________________________   INITIAL IMPRESSION / ASSESSMENT AND PLAN / ED COURSE    Contacted poison control. Patient was advised to monitor for dizziness, excessive drowsiness, and severe nausea with vomiting. If any of these are present, do not take the evening dose. If none of these are happening, go ahead and take the dose as prescribed.   Pertinent labs & imaging results that were available during my care of the patient were reviewed by me and considered in my medical decision making (see chart for details). ____________________________________________   FINAL CLINICAL IMPRESSION(S) / ED DIAGNOSES  Final diagnoses:  Accidental medication error, initial encounter       Chinita Pester, FNP 06/03/21 1353    Willy Eddy, MD 06/03/21 1443

## 2021-08-08 ENCOUNTER — Emergency Department: Payer: Medicaid Other

## 2021-08-08 ENCOUNTER — Other Ambulatory Visit: Payer: Self-pay

## 2021-08-08 ENCOUNTER — Encounter: Payer: Self-pay | Admitting: Emergency Medicine

## 2021-08-08 DIAGNOSIS — J4521 Mild intermittent asthma with (acute) exacerbation: Secondary | ICD-10-CM | POA: Diagnosis not present

## 2021-08-08 DIAGNOSIS — R0602 Shortness of breath: Secondary | ICD-10-CM | POA: Diagnosis present

## 2021-08-08 LAB — BASIC METABOLIC PANEL
Anion gap: 6 (ref 5–15)
BUN: 15 mg/dL (ref 6–20)
CO2: 26 mmol/L (ref 22–32)
Calcium: 9.3 mg/dL (ref 8.9–10.3)
Chloride: 105 mmol/L (ref 98–111)
Creatinine, Ser: 1.09 mg/dL (ref 0.61–1.24)
GFR, Estimated: >60 mL/min (ref >60)
Glucose, Bld: 100 mg/dL — ABNORMAL HIGH (ref 70–99)
Potassium: 3.5 mmol/L (ref 3.5–5.1)
Sodium: 137 mmol/L (ref 135–145)

## 2021-08-08 LAB — CBC
HCT: 42.8 % (ref 39.0–52.0)
Hemoglobin: 15.2 g/dL (ref 13.0–17.0)
MCH: 33.1 pg (ref 26.0–34.0)
MCHC: 35.5 g/dL (ref 30.0–36.0)
MCV: 93.2 fL (ref 80.0–100.0)
Platelets: 214 10*3/uL (ref 150–400)
RBC: 4.59 MIL/uL (ref 4.22–5.81)
RDW: 12.3 % (ref 11.5–15.5)
WBC: 5.9 10*3/uL (ref 4.0–10.5)
nRBC: 0 % (ref 0.0–0.2)

## 2021-08-08 LAB — TROPONIN I (HIGH SENSITIVITY): Troponin I (High Sensitivity): 6 ng/L (ref <18)

## 2021-08-08 NOTE — ED Triage Notes (Signed)
Pt brought in by [facility] with complaints of shortness of breath all this evening. He has history of asthma and has not had to use inhaler recently.

## 2021-08-09 ENCOUNTER — Emergency Department
Admission: EM | Admit: 2021-08-09 | Discharge: 2021-08-09 | Disposition: A | Discharge status: Home / Self Care | Payer: Medicaid Other | Attending: Emergency Medicine | Admitting: Emergency Medicine

## 2021-08-09 DIAGNOSIS — J4521 Mild intermittent asthma with (acute) exacerbation: Secondary | ICD-10-CM

## 2021-08-09 LAB — TROPONIN I (HIGH SENSITIVITY): Troponin I (High Sensitivity): 12 ng/L (ref <18)

## 2021-08-09 MED ORDER — PREDNISONE 20 MG PO TABS
60.0000 mg | ORAL_TABLET | Freq: Once | ORAL | Status: AC
  Administered 2021-08-09: 60 mg via ORAL
  Filled 2021-08-09: qty 3

## 2021-08-09 MED ORDER — IPRATROPIUM-ALBUTEROL 0.5-2.5 (3) MG/3ML IN SOLN
3.0000 mL | Freq: Once | RESPIRATORY_TRACT | Status: AC
  Administered 2021-08-09: 3 mL via RESPIRATORY_TRACT
  Filled 2021-08-09: qty 3

## 2021-08-09 MED ORDER — PREDNISONE 20 MG PO TABS: 60.0000 mg | ORAL_TABLET | Freq: Every day | ORAL | 0 refills | Status: AC

## 2021-08-09 NOTE — ED Provider Notes (Signed)
[facility] Emergency Department Provider Note  ____________________________________________   Event Date/Time   First MD Initiated Contact with Patient 08/09/21 0134     (approximate)  I have reviewed the triage vital signs and the nursing notes.   HISTORY  Chief Complaint Shortness of Breath    HPI Isaac Vaughn is a 30 y.o. male with history of developmental delay, asthma, epilepsy who presents to the emergency department with complaints of shortness of breath.  No chest pain, fevers, cough, lower extremity swelling or pain.  States he feels like his throat is swelling up as well.  No new exposures.  No rash, itching.  Has not used his albuterol inhaler at home.  No history of PE, DVT, exogenous estrogen use, recent fractures, surgery, trauma, hospitalization, prolonged travel or other immobilization. No lower extremity swelling or pain. No calf tenderness.        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 No pertinent surgical history.  Prior to Admission medications   Medication Sig Start Date End Date Taking? Authorizing Provider  albuterol (VENTOLIN HFA) 108 (90 Base) MCG/ACT inhaler Inhale 2 puffs into the lungs every 6 (six) hours as needed for wheezing or shortness of breath.    [provider]  amLODipine (NORVASC) 5 MG tablet Take 1 tablet (5 mg total) by mouth daily. 03/19/20 04/18/20  Gillis Santa, MD  dicyclomine (BENTYL) 20 MG tablet Take 20 mg by mouth every 6 (six) hours as needed for cramping. 04/29/19 03/13/20  [provider]  famotidine (PEPCID) 20 MG tablet Take 1 tablet (20 mg total) by mouth daily. 07/17/19 07/16/20   Phineas Semen, MD  levETIRAcetam (KEPPRA) 100 MG/ML solution Take 15 mLs (1,500 mg total) by mouth 2 (two) times daily. 02/04/21 03/06/21  Sharman Cheek, MD  Midazolam (NAYZILAM) 5 MG/0.1ML SOLN Place 5 mg into the nose as directed. May repeat in other nostril after 10 minutes if needed 02/04/21   Sharman Cheek, MD  montelukast (SINGULAIR) 10 MG tablet Take 1 tablet (10 mg total) by mouth daily. 12/11/17   Joni Reining, PA-C  ondansetron (ZOFRAN-ODT) 4 MG disintegrating tablet Take by mouth. 05/04/19   [provider]  polyethylene glycol (MIRALAX / GLYCOLAX) 17 g packet Take 17 g by mouth 3 (three) times daily. 04/29/19   [provider]  VIMPAT 50 MG TABS tablet Take 4 tablets (200 mg total) by mouth 2 (two) times daily. 03/18/20 04/17/20  Gillis Santa, MD    Allergies Other  Family History  Problem Relation Age of Onset   Hypertension Mother    Asthma Mother     Social History Social History   Tobacco Use   Smoking status: Never   Smokeless tobacco: Never  Substance Use Topics   Alcohol use: No    Alcohol/week: 0.0 standard drinks   Drug use: No    Review of Systems Constitutional: No fever. Eyes: No visual changes. ENT: No sore throat. Cardiovascular: Denies chest pain. Respiratory: + shortness of breath. Gastrointestinal: No nausea, vomiting, diarrhea. Genitourinary: Negative for dysuria. Musculoskeletal: Negative for back pain. Skin: Negative for rash. Neurological: Negative for focal  weakness or numbness.  ____________________________________________   PHYSICAL EXAM:  VITAL SIGNS: ED Triage Vitals  Enc Vitals Group     BP 08/08/21 2121 (!) 147/100     Pulse Rate 08/08/21 2121 (!) 105     Resp 08/08/21 2121 20     Temp 08/08/21 2121 98.5 F (36.9 C)     Temp Source 08/08/21 2121 Oral     SpO2 08/08/21 2121 95 %     Weight 08/08/21 2122 (!) 301 lb (136.5 kg)     Height 08/08/21 2122 6' (1.829 m)     Head Circumference --      Peak  Flow --      Pain Score 08/08/21 2122 0     Pain Loc --      Pain Edu? --      Excl. in GC? --    CONSTITUTIONAL: Alert and oriented and responds appropriately to questions. Well-appearing; well-nourished HEAD: Normocephalic EYES: Conjunctivae clear, pupils appear equal, EOM appear intact ENT: normal nose; moist mucous membranes NECK: Supple, normal ROM CARD: RRR; S1 and S2 appreciated; no murmurs, no clicks, no rubs, no gallops RESP: Normal chest excursion without splinting or tachypnea; minimal scattered expiratory wheezes, no rhonchi or rales, no hypoxia or respiratory distress, speaking full sentences ABD/GI: Normal bowel sounds; non-distended; soft, non-tender, no rebound, no guarding, no peritoneal signs, no hepatosplenomegaly BACK: The back appears normal EXT: Normal ROM in all joints; no deformity noted, no edema; no cyanosis, no calf tenderness or calf swelling SKIN: Normal color for age and race; warm; no rash on exposed skin NEURO: Moves all extremities equally PSYCH: The patient's mood and manner are appropriate.  ____________________________________________   LABS (all labs ordered are listed, but only abnormal results are displayed)  Labs Reviewed  BASIC METABOLIC PANEL - Abnormal; Notable for the following components:      Result Value   Glucose, Bld 100 (*)    All other components within normal limits  CBC  TROPONIN I (HIGH SENSITIVITY)  TROPONIN I (HIGH SENSITIVITY)   ____________________________________________  EKG   EKG Interpretation  Date/Time:  Tuesday August 08 2021 21:25:27 EDT Ventricular Rate:  110 PR Interval:  136 QRS Duration: 94 QT Interval:  318 QTC Calculation: 430 R Axis:   51 Text Interpretation: Sinus tachycardia Nonspecific T wave abnormality Abnormal ECG Confirmed by Rochele Raring [phone]) on 08/09/2021 1:34:29 AM        ____________________________________________  RADIOLOGY Normajean Baxter Alivya Wegman, personally viewed and evaluated  these images (plain radiographs) as part of my medical decision making, as well as reviewing the written report by the radiologist.  ED MD interpretation: Chest x-ray clear.  Official radiology report(s): DG Chest 2 View  Result Date: 08/08/2021 CLINICAL DATA:  Shortness of breath EXAM: CHEST - 2 VIEW COMPARISON:  08/05/2020 FINDINGS: The heart size and mediastinal contours are within normal limits. Both lungs are clear. The visualized skeletal structures are unremarkable. IMPRESSION: No active cardiopulmonary disease. Electronically Signed   By: Charlett Nose M.D.   On: 08/08/2021 21:39    ____________________________________________   PROCEDURES  Procedure(s) performed (including Critical Care):  Procedures    ____________________________________________   INITIAL IMPRESSION / ASSESSMENT AND PLAN / ED COURSE  As part of my medical decision making, I reviewed the following data within the electronic MEDICAL RECORD NUMBER History obtained from family, Nursing notes reviewed and incorporated, Labs reviewed , EKG interpreted , Old EKG reviewed, Radiograph reviewed , and Notes from prior ED visits  Patient here with complaints of shortness of breath.  Suspect asthma exacerbation.  Does have some scattered wheezing on exam.  Chest x-ray clear.  No infiltrate, edema, pneumothorax.  Denies any chest pain.  EKG shows sinus tachycardia without other signs of ischemia.  Troponin x2 negative.  No risk factors for PE.  Will give breathing treatment, prednisone and reassess.  ED PROGRESS  Patient reports feeling much better after breathing treatment.  Lungs now clear to auscultation with good aeration.  Will discharge with prednisone burst.  They report they have albuterol inhaler and albuterol for his nebulizer at home.  Discussed return precautions.  They also have a PCP for follow-up as needed.  At this time, I do not feel there is any life-threatening condition present. I have reviewed,  interpreted and discussed all results (EKG, imaging, lab, urine as appropriate) and exam findings with patient/family. I have reviewed nursing notes and appropriate previous records.  I feel the patient is safe to be discharged home without further emergent workup and can continue workup as an outpatient as needed. Discussed usual and customary return precautions. Patient/family verbalize understanding and are comfortable with this plan.  Outpatient follow-up has been provided as needed. All questions have been answered.  ____________________________________________   FINAL CLINICAL IMPRESSION(S) / ED DIAGNOSES  Final diagnoses:  Mild intermittent asthma with exacerbation     ED Discharge Orders     None       *Please note:  JAASIEL HOLLYFIELD was evaluated in Emergency Department on 08/09/2021 for the symptoms described in the history of present illness. He was evaluated in the context of the global COVID-19 pandemic, which necessitated consideration that the patient might be at risk for infection with the SARS-CoV-2 virus that causes COVID-19. Institutional protocols and algorithms that pertain to the evaluation of patients at risk for COVID-19 are in a state of rapid change based on information released by regulatory bodies including the CDC and federal and state organizations. These policies and algorithms were followed during the patient's care in the ED.  Some ED evaluations and interventions may be delayed as a result of limited staffing during and the pandemic.*   Note:  This document was prepared using Dragon voice recognition software and may include unintentional dictation errors.    Marrissa Dai, Layla Maw, DO 08/09/21 [phone]

## 2021-08-09 NOTE — Discharge Instructions (Addendum)
You may use your albuterol inhaler for albuterol nebulizer every 2-4 hours as needed for shortness of breath, wheezing.  Please begin taking your steroid prescription the evening of 08/09/2021.  Your labs, chest x-ray today are reassuring.  No sign of pneumonia.

## 2022-04-04 IMAGING — CR DG CHEST 2V
2 series · 3 of 3 positions shown · non-contrast
Comparison: 08/05/2020

CLINICAL DATA: Shortness of breath

EXAM:
CHEST - 2 VIEW

[Series 1: chest pa · 0.14mm/px · 2 of 2 slices shown]
[im 1/2]
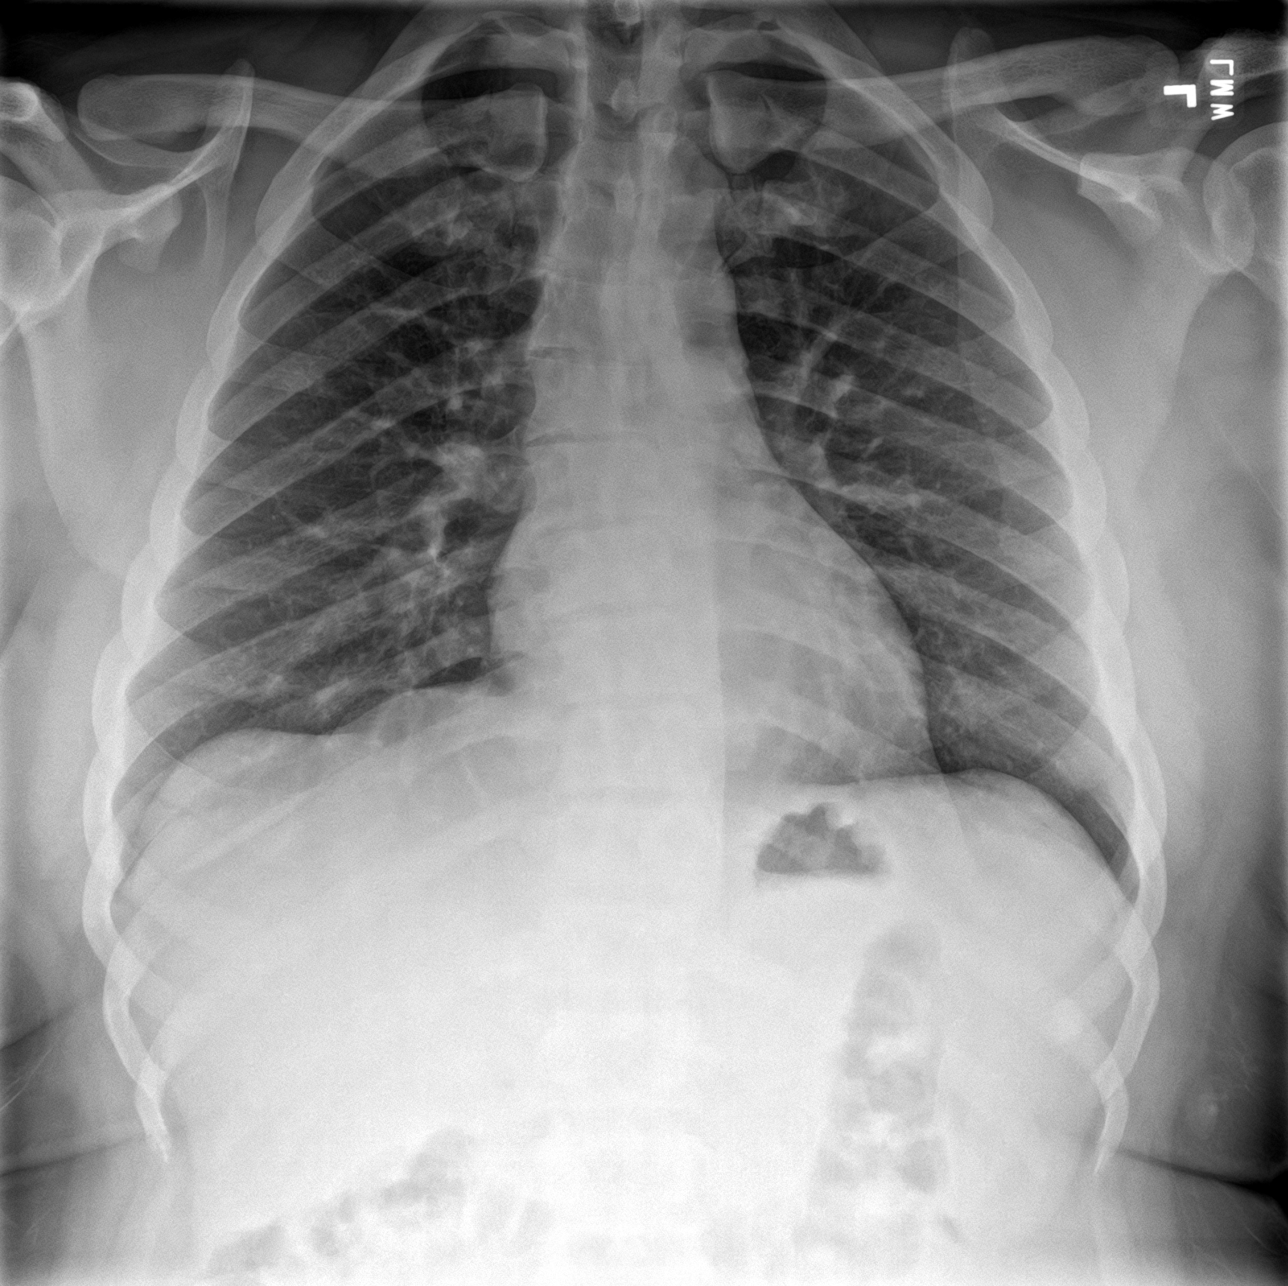
[im 2/2]
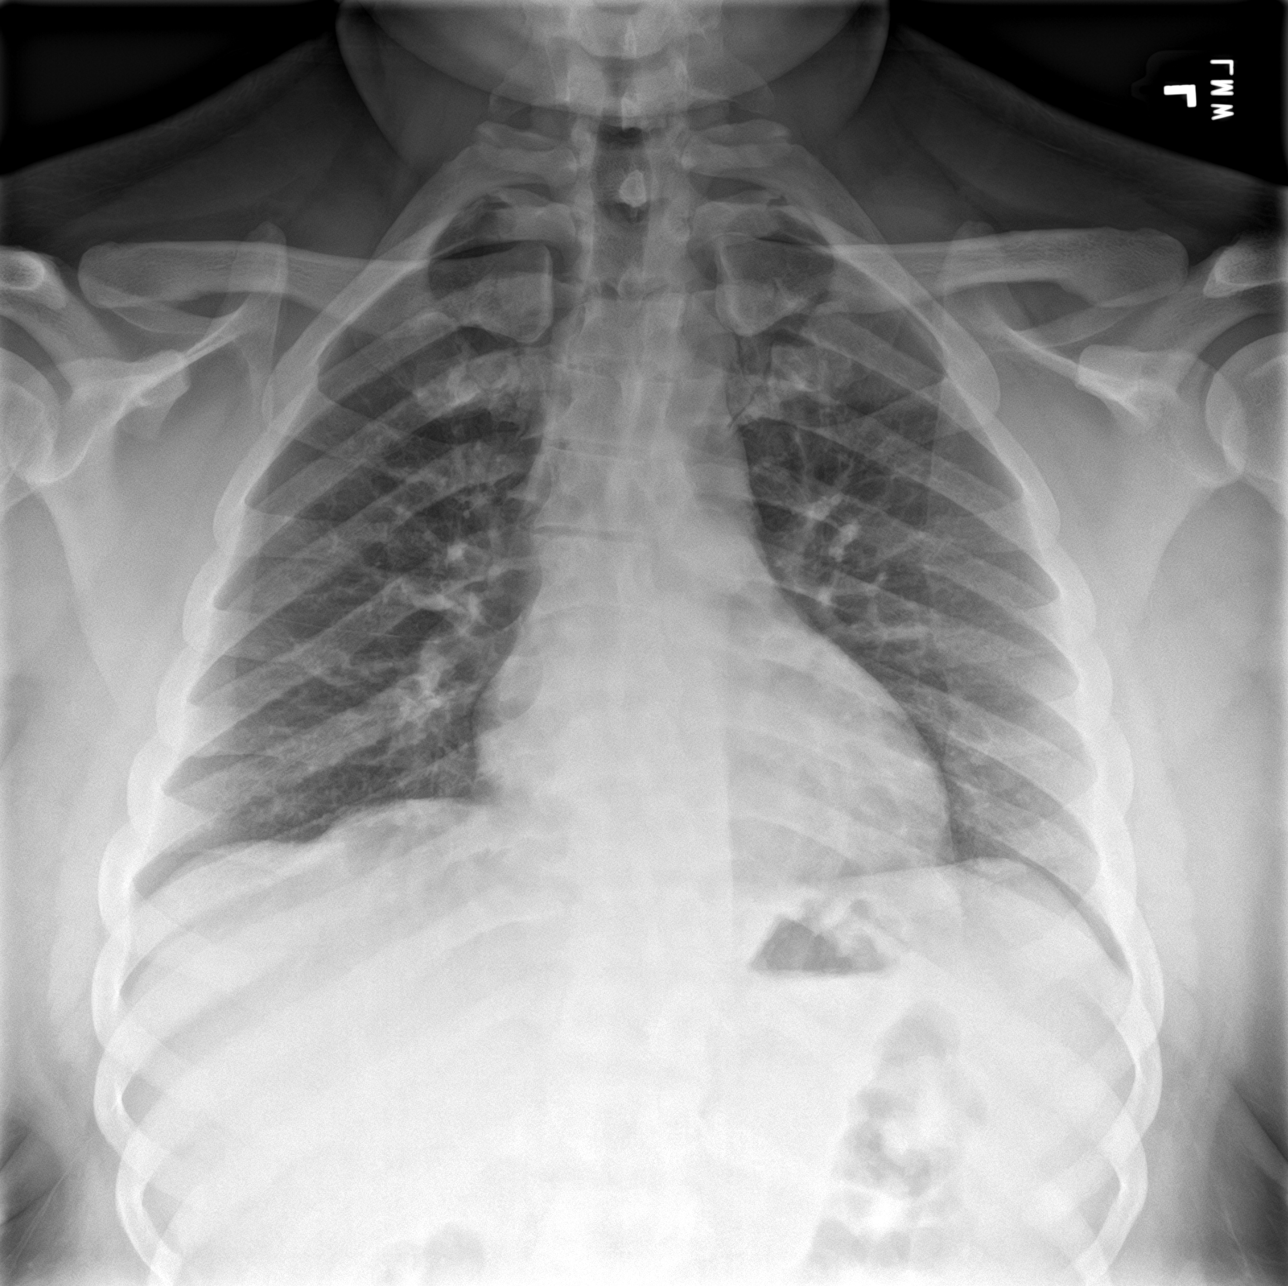

[chest lat]
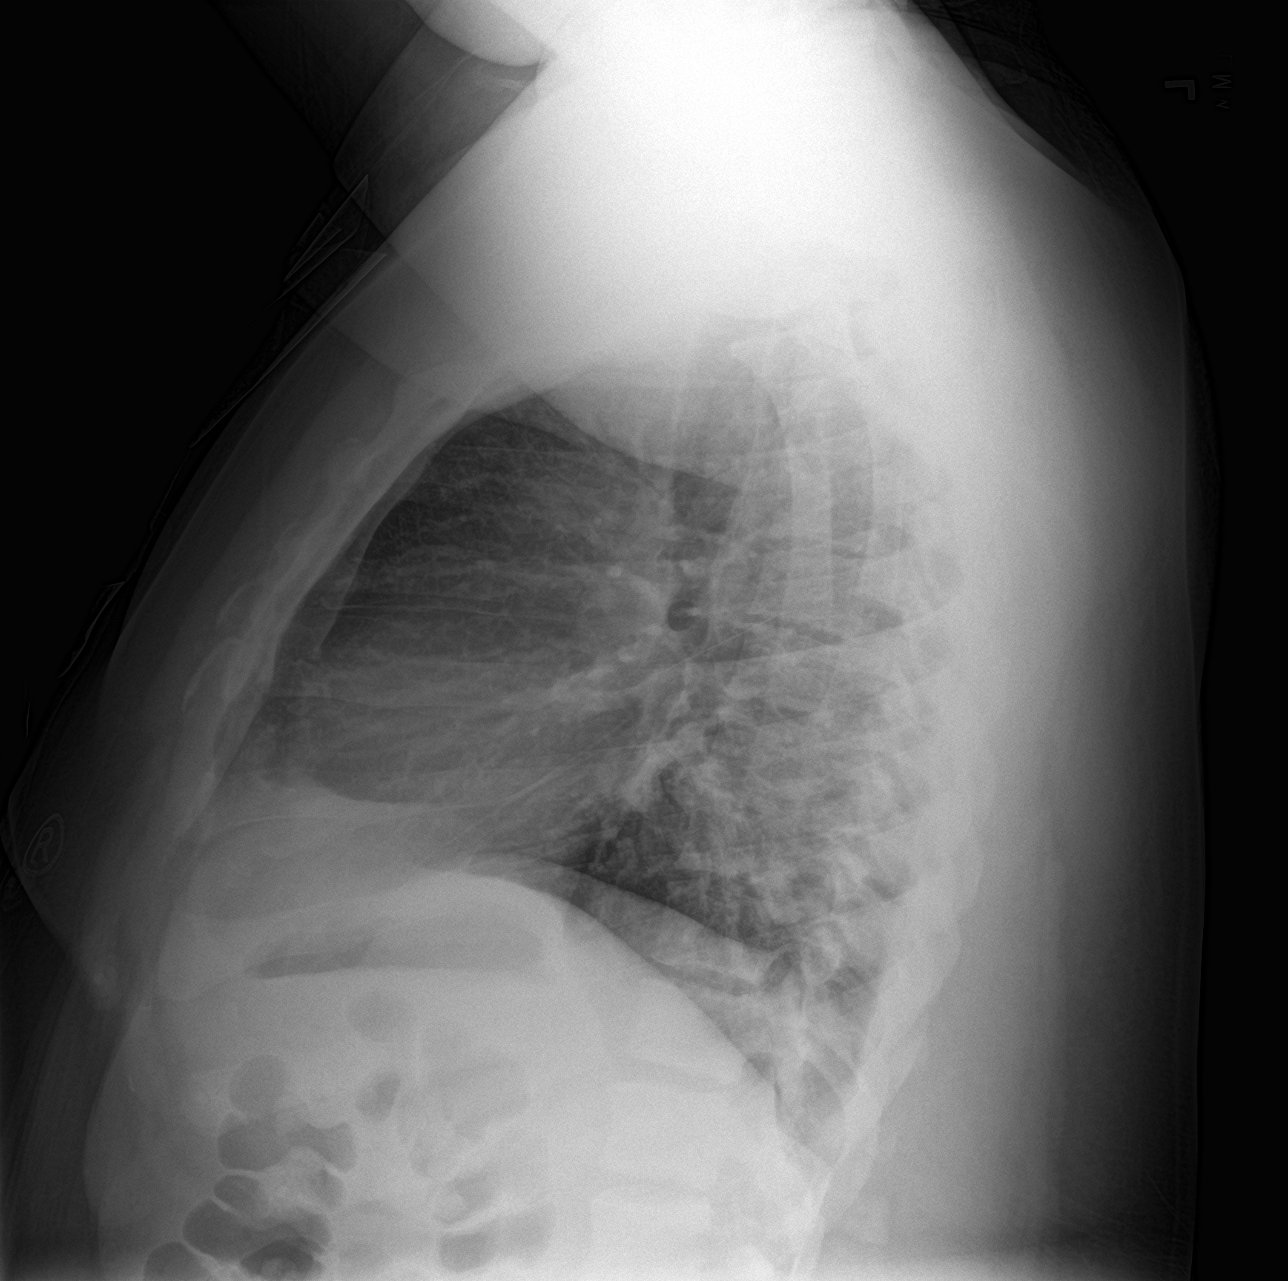

[3 of 3 positions shown; findings below may reference images not displayed]

FINDINGS: The heart size and mediastinal contours are within normal limits.
Both lungs are clear. The visualized skeletal structures are
unremarkable.
IMPRESSION: No active cardiopulmonary disease.

## 2023-09-05 ENCOUNTER — Other Ambulatory Visit: Payer: Self-pay

## 2023-09-05 ENCOUNTER — Emergency Department: Payer: MEDICAID

## 2023-09-05 ENCOUNTER — Encounter: Payer: Self-pay | Admitting: Emergency Medicine

## 2023-09-05 ENCOUNTER — Emergency Department
Admission: EM | Admit: 2023-09-05 | Discharge: 2023-09-05 | Disposition: A | Discharge status: Home / Self Care | Payer: MEDICAID | Attending: Emergency Medicine | Admitting: Emergency Medicine

## 2023-09-05 DIAGNOSIS — R519 Headache, unspecified: Secondary | ICD-10-CM | POA: Diagnosis not present

## 2023-09-05 DIAGNOSIS — R197 Diarrhea, unspecified: Secondary | ICD-10-CM | POA: Insufficient documentation

## 2023-09-05 DIAGNOSIS — Z20822 Contact with and (suspected) exposure to covid-19: Secondary | ICD-10-CM | POA: Insufficient documentation

## 2023-09-05 DIAGNOSIS — J45909 Unspecified asthma, uncomplicated: Secondary | ICD-10-CM | POA: Diagnosis not present

## 2023-09-05 DIAGNOSIS — R112 Nausea with vomiting, unspecified: Secondary | ICD-10-CM | POA: Insufficient documentation

## 2023-09-05 LAB — CBC
HCT: 45.4 % (ref 39.0–52.0)
Hemoglobin: 15.7 g/dL (ref 13.0–17.0)
MCH: 31.8 pg (ref 26.0–34.0)
MCHC: 34.6 g/dL (ref 30.0–36.0)
MCV: 92.1 fL (ref 80.0–100.0)
Platelets: 237 10*3/uL (ref 150–400)
RBC: 4.93 MIL/uL (ref 4.22–5.81)
RDW: 12.2 % (ref 11.5–15.5)
WBC: 5 10*3/uL (ref 4.0–10.5)
nRBC: 0 % (ref 0.0–0.2)

## 2023-09-05 LAB — COMPREHENSIVE METABOLIC PANEL
ALT: 21 U/L (ref 0–44)
AST: 19 U/L (ref 15–41)
Albumin: 4.1 g/dL (ref 3.5–5.0)
Alkaline Phosphatase: 50 U/L (ref 38–126)
Anion gap: 9 (ref 5–15)
BUN: 12 mg/dL (ref 6–20)
CO2: 27 mmol/L (ref 22–32)
Calcium: 9 mg/dL (ref 8.9–10.3)
Chloride: 101 mmol/L (ref 98–111)
Creatinine, Ser: 1.08 mg/dL (ref 0.61–1.24)
GFR, Estimated: >60 mL/min (ref >60)
Glucose, Bld: 104 mg/dL — ABNORMAL HIGH (ref 70–99)
Potassium: 3.7 mmol/L (ref 3.5–5.1)
Sodium: 137 mmol/L (ref 135–145)
Total Bilirubin: 0.8 mg/dL (ref 0.3–1.2)
Total Protein: 8 g/dL (ref 6.5–8.1)

## 2023-09-05 LAB — URINALYSIS, ROUTINE W REFLEX MICROSCOPIC
Bilirubin Urine: NEGATIVE
Glucose, UA: 50 mg/dL — AB
Hgb urine dipstick: NEGATIVE
Ketones, ur: NEGATIVE mg/dL
Leukocytes,Ua: NEGATIVE
Nitrite: NEGATIVE
Protein, ur: NEGATIVE mg/dL
Specific Gravity, Urine: 1.008 (ref 1.005–1.030)
pH: 7 (ref 5.0–8.0)

## 2023-09-05 LAB — LIPASE, BLOOD: Lipase: 34 U/L (ref 11–51)

## 2023-09-05 LAB — SARS CORONAVIRUS 2 BY RT PCR: SARS Coronavirus 2 by RT PCR: NEGATIVE

## 2023-09-05 MED ORDER — ONDANSETRON HCL 4 MG/2ML IJ SOLN
4.0000 mg | Freq: Once | INTRAMUSCULAR | Status: AC
  Administered 2023-09-05: 4 mg via INTRAVENOUS
  Filled 2023-09-05: qty 2

## 2023-09-05 MED ORDER — SODIUM CHLORIDE 0.9 % IV BOLUS
1000.0000 mL | Freq: Once | INTRAVENOUS | Status: AC
  Administered 2023-09-05: 1000 mL via INTRAVENOUS

## 2023-09-05 MED ORDER — KETOROLAC TROMETHAMINE 15 MG/ML IJ SOLN
15.0000 mg | Freq: Once | INTRAMUSCULAR | Status: AC
  Administered 2023-09-05: 15 mg via INTRAVENOUS
  Filled 2023-09-05: qty 1

## 2023-09-05 NOTE — ED Provider Notes (Signed)
[facility] Provider Note    Event Date/Time   First MD Initiated Contact with Patient 09/05/23 1227     (approximate)   History   Abdominal Pain   HPI  Isaac Vaughn is a 32 y.o. male with a past medical history of intellectual disability, seizure disorder, asthma who presents today for evaluation of headache and vomiting.  Patient reports that he has been having the symptoms for the past couple of days but feels like his headache worsened today.  He reports that he has not had a seizure in a long time.  He reports that he has been compliant with his antiseizure medications.  He denies abdominal pain.  He reports that he has had diarrhea over the last couple of days as well, nonbloody.  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   Chest pain 03/13/2020   Altered mental status 06/07/2016          Physical Exam   Triage Vital Signs: ED Triage Vitals  Encounter Vitals Group     BP 09/05/23 1123 (!) 143/107     Systolic BP Percentile --      Diastolic BP Percentile --      Pulse Rate 09/05/23 1123 80     Resp 09/05/23 1123 17     Temp 09/05/23 1123 97.7 F (36.5 C)     Temp Source 09/05/23 1123 Oral     SpO2 09/05/23 1123 95 %     Weight 09/05/23 1124 300 lb (136.1 kg)     Height 09/05/23 1124 6' (1.829 m)     Head Circumference --      Peak Flow --      Pain Score 09/05/23 1124 10     Pain Loc --      Pain Education --      Exclude from Growth Chart --     Most recent vital signs: Vitals:   09/05/23 1123  BP: (!) 143/107  Pulse: 80  Resp: 17  Temp: 97.7 F (36.5 C)  SpO2: 95%    Physical Exam Vitals and nursing note reviewed.  Constitutional:      General: Awake and alert. No acute distress.    Appearance: Normal appearance. The patient is obese.  HENT:     Head: Normocephalic and atraumatic.     Mouth: Mucous membranes  are moist.  Eyes:     General: PERRL. Normal EOMs        Right eye: No discharge.        Left eye: No discharge.     Conjunctiva/sclera: Conjunctivae normal.  Cardiovascular:     Rate and Rhythm: Normal rate and regular rhythm.     Pulses: Normal pulses.  Pulmonary:     Effort: Pulmonary effort is normal. No respiratory distress.     Breath sounds: Normal breath sounds.  Abdominal:     Abdomen is soft. There is no abdominal tenderness. No rebound or guarding. No distention. Musculoskeletal:        General: No swelling. Normal range of motion.     Cervical back: Normal range of motion and neck supple.  Skin:    General: Skin is warm and dry.     Capillary Refill: Capillary refill takes less than 2 seconds.     Findings: No rash.  Neurological:     Mental Status: The patient is awake  and alert.  Neurological: GCS 15 alert and oriented x3 Normal speech, no expressive or receptive aphasia or dysarthria Cranial nerves II through XII intact Normal visual fields 5 out of 5 strength in all 4 extremities with intact sensation throughout No extremity drift Normal finger-to-nose testing, no limb or truncal ataxia      ED Results / Procedures / Treatments   Labs (all labs ordered are listed, but only abnormal results are displayed) Labs Reviewed  COMPREHENSIVE METABOLIC PANEL - Abnormal; Notable for the following components:      Result Value   Glucose, Bld 104 (*)    All other components within normal limits  URINALYSIS, ROUTINE W REFLEX MICROSCOPIC - Abnormal; Notable for the following components:   Color, Urine STRAW (*)    APPearance CLEAR (*)    Glucose, UA 50 (*)    All other components within normal limits  SARS CORONAVIRUS 2 BY RT PCR  LIPASE, BLOOD  CBC     EKG     RADIOLOGY I independently reviewed and interpreted imaging and agree with radiologists findings.     PROCEDURES:  Critical Care performed:   Procedures   MEDICATIONS ORDERED IN  ED: Medications  ondansetron (ZOFRAN) injection 4 mg (4 mg Intravenous Given 09/05/23 1310)  sodium chloride 0.9 % bolus 1,000 mL (0 mLs Intravenous Stopped 09/05/23 1500)  ketorolac (TORADOL) 15 MG/ML injection 15 mg (15 mg Intravenous Given 09/05/23 1310)     IMPRESSION / MDM / ASSESSMENT AND PLAN / ED COURSE  I reviewed the triage vital signs and the nursing notes.   Differential diagnosis includes, but is not limited to, tension headache, URI, COVID-19, dehydration, electrolyte disarray, intracranial mass.  Patient is awake and alert, hemodynamically stable and afebrile.  He is nontoxic in appearance.  He is neurologically intact.  IV was established and labs are obtained.  Labs are overall reassuring.  COVID added given his headache and diarrhea and this was negative as well.  Given his history of seizures and his new headache, CT scan obtained.  He was treated symptomatically with resolution of his symptoms.  He has no abdominal tenderness or complaints of abdominal pain.  Possible viral etiology.  No nuchal rigidity or fever to suggest meningitis.  Recommended close outpatient follow-up and strict return precautions.  Patient understands and agrees with plan.  He was discharged in stable condition.   Patient's presentation is most consistent with acute complicated illness / injury requiring diagnostic workup.   Clinical Course as of 09/05/23 1503  Thu Sep 05, 2023  1451 Patient reevaluated, reports that headache and nausea have resolved [JP]    Clinical Course User Index [JP] Audris Speaker, Herb Grays, PA-C     FINAL CLINICAL IMPRESSION(S) / ED DIAGNOSES   Final diagnoses:  Nausea vomiting and diarrhea  Acute nonintractable headache, unspecified headache type     Rx / DC Orders   ED Discharge Orders     None        Note:  This document was prepared using Dragon voice recognition software and may include unintentional dictation errors.   Keturah Shavers 09/05/23  1503    Sharman Cheek, MD 09/08/23 [phone]

## 2023-09-05 NOTE — Discharge Instructions (Signed)
Your blood work and CAT scan are normal.  Your COVID swab is negative.  Please follow-up with your outpatient provider.  Please return for any new, worsening, or change in symptoms or other concerns.  It was a pleasure caring for you today.

## 2023-09-05 NOTE — ED Triage Notes (Signed)
Pt sts that he has been vomiting and having a headache and he wants to know why.
